# Patient Record
Sex: Female | Born: 1980 | Race: White | Hispanic: No | Marital: Married
Health system: Midwestern US, Community
[De-identification: ages and names within clinical notes are randomized; demographics above are authoritative.]

## PROBLEM LIST (undated history)

## (undated) DIAGNOSIS — Z09 Encounter for follow-up examination after completed treatment for conditions other than malignant neoplasm: Secondary | ICD-10-CM

## (undated) DIAGNOSIS — R928 Other abnormal and inconclusive findings on diagnostic imaging of breast: Secondary | ICD-10-CM

## (undated) DIAGNOSIS — Z9889 Other specified postprocedural states: Secondary | ICD-10-CM

## (undated) DIAGNOSIS — Z1231 Encounter for screening mammogram for malignant neoplasm of breast: Secondary | ICD-10-CM

## (undated) DIAGNOSIS — Z8669 Personal history of other diseases of the nervous system and sense organs: Secondary | ICD-10-CM

## (undated) HISTORY — DX: Personal history of other diseases of the nervous system and sense organs: Z86.69

---
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
## 2015-05-28 ENCOUNTER — Ambulatory Visit: Payer: Self-pay | Admitting: Family

## 2015-06-12 ENCOUNTER — Telehealth: Payer: Self-pay

## 2015-06-12 NOTE — Telephone Encounter (Signed)
Pre Visit call completed. 

## 2015-06-17 ENCOUNTER — Ambulatory Visit (INDEPENDENT_AMBULATORY_CARE_PROVIDER_SITE_OTHER): Payer: 59 | Admitting: Family

## 2015-06-17 ENCOUNTER — Encounter: Payer: Self-pay | Admitting: Family

## 2015-06-17 VITALS — BP 110/86 | HR 89 | Temp 98.2°F | Resp 15 | Ht 67.5 in | Wt 156.2 lb

## 2015-06-17 DIAGNOSIS — R202 Paresthesia of skin: Secondary | ICD-10-CM | POA: Insufficient documentation

## 2015-06-17 DIAGNOSIS — Z114 Encounter for screening for human immunodeficiency virus [HIV]: Secondary | ICD-10-CM | POA: Diagnosis not present

## 2015-06-17 LAB — BASIC METABOLIC PANEL
BUN: 10 mg/dL (ref 6–23)
CHLORIDE: 104 meq/L (ref 96–112)
CO2: 27 mEq/L (ref 19–32)
CREATININE: 0.74 mg/dL (ref 0.40–1.20)
Calcium: 9.2 mg/dL (ref 8.4–10.5)
GFR: 94.88 mL/min (ref >60.00)
GLUCOSE: 87 mg/dL (ref 70–99)
Potassium: 4.5 mEq/L (ref 3.5–5.1)
Sodium: 142 mEq/L (ref 135–145)

## 2015-06-17 LAB — CBC WITH DIFFERENTIAL/PLATELET
Basophils Absolute: 0 10*3/uL (ref 0.0–0.1)
Basophils Relative: 0.5 % (ref 0.0–3.0)
EOS PCT: 1.8 % (ref 0.0–5.0)
Eosinophils Absolute: 0.1 10*3/uL (ref 0.0–0.7)
HCT: 43.5 % (ref 36.0–46.0)
Hemoglobin: 14.6 g/dL (ref 12.0–15.0)
LYMPHS ABS: 1.8 10*3/uL (ref 0.7–4.0)
Lymphocytes Relative: 28.2 % (ref 12.0–46.0)
MCHC: 33.4 g/dL (ref 30.0–36.0)
MCV: 86.4 fl (ref 78.0–100.0)
MONO ABS: 0.5 10*3/uL (ref 0.1–1.0)
Monocytes Relative: 8.2 % (ref 3.0–12.0)
NEUTROS ABS: 4 10*3/uL (ref 1.4–7.7)
NEUTROS PCT: 61.3 % (ref 43.0–77.0)
PLATELETS: 240 10*3/uL (ref 150.0–400.0)
RBC: 5.04 Mil/uL (ref 3.87–5.11)
RDW: 12.5 % (ref 11.5–15.5)
WBC: 6.5 10*3/uL (ref 4.0–10.5)

## 2015-06-17 LAB — VITAMIN B12: VITAMIN B 12: 359 pg/mL (ref 211–911)

## 2015-06-17 LAB — FOLATE: Folate: 9.5 ng/mL (ref >5.9)

## 2015-06-17 LAB — TSH: TSH: 1.6 u[IU]/mL (ref 0.35–4.50)

## 2015-06-17 NOTE — Progress Notes (Signed)
Pre visit review using our clinic review tool, if applicable. No additional management support is needed unless otherwise documented below in the visit note. 

## 2015-06-17 NOTE — Progress Notes (Signed)
Subjective:    Patient ID: Chelsea Stafford, female    DOB: [birth year], 34 y.o.   MRN: [id number]  HPI  Chelsea Stafford is a 34 yr old female who presents today to establish care.    Reports 2 months ago she developed bilateral upper arm numbness which occurs when she rolls on her side, "almost immediately."  Hands do not tingle.  Some bilateral thigh numbness.  Denies associated neck pain, or muscle  weakness.   Arm numbness resolved when she is not laying on her side.    Review of Systems  Constitutional:       Gained 6 pounds this month  HENT: Negative for hearing loss and rhinorrhea.        Denies diplopia  Eyes: Negative for visual disturbance.       Wears glasses  Respiratory: Negative for cough and shortness of breath.   Cardiovascular: Negative for chest pain and palpitations.  Gastrointestinal: Negative for diarrhea and constipation.       Hemorrhoids since childbirth  Genitourinary: Negative for dysuria and frequency.  Musculoskeletal: Negative for myalgias and arthralgias.  Skin: Negative for rash.  Neurological: Negative for headaches.       Denies issues with coordination  Hematological: Negative for adenopathy.  Psychiatric/Behavioral:       Reports mood up and down this month- attributes to missing some of her ocps- has ob apt this month   History reviewed. No pertinent past medical history.  Social History   Social History  . Marital Status: Married    Spouse Name: N/A  . Number of Children: N/A  . Years of Education: N/A   Occupational History  . Not on file.   Social History Main Topics  . Smoking status: Never Smoker   . Smokeless tobacco: Never Used  . Alcohol Use: 0.0 oz/week    0 Standard drinks or equivalent per week     Comment: "1 beer a month or less"  . Drug Use: Not on file  . Sexual Activity: Not on file   Other Topics Concern  . Not on file   Social History Narrative   Married   3 children   2009- son, [name]   2011- [name]   2013-  [name]   Agricultural engineer   Completed bachelors at [employer]   Enjoys reading, computer, Photographer   Grew up in [place]    History reviewed. No pertinent past surgical history.  Family History  Problem Relation Age of Onset  . Hypertension Mother   . Gout Father   . Hypertension Father   . Ovarian cysts Sister     No Known Allergies  No current outpatient prescriptions on file prior to visit.   No current facility-administered medications on file prior to visit.    BP 110/86 mmHg  Pulse 89  Temp(Src) 98.2 F (36.8 C) (Oral)  Resp 15  Ht 5' 7.5" (1.715 m)  Wt 156 lb 3.2 oz (70.852 kg)  BMI 24.09 kg/m2  SpO2 100%  LMP 06/17/2015       Objective:   Physical Exam  Constitutional: She is oriented to person, place, and time. She appears well-developed and well-nourished. No distress.  HENT:  Head: Normocephalic and atraumatic.  Cardiovascular: Normal rate and regular rhythm.   No murmur heard. Pulmonary/Chest: Effort normal and breath sounds normal. No respiratory distress. She has no wheezes. She has no rales.  Musculoskeletal: She exhibits no edema.  Neurological: She is alert and  oriented to person, place, and time. She exhibits normal muscle tone. Gait normal.  Reflex Scores:      Bicep reflexes are 2+ on the right side and 2+ on the left side.      Brachioradialis reflexes are 2+ on the right side and 2+ on the left side. Bilateral UE/LE strength is 5/5  Skin:  2 raised lesions noted on left upper back  Psychiatric: She has a normal mood and affect. Her behavior is normal. Judgment and thought content normal.          Assessment & Plan:  Advised pt to schedule mole removal.

## 2015-06-17 NOTE — Patient Instructions (Signed)
Please complete lab work prior to leaving.   

## 2015-06-17 NOTE — Assessment & Plan Note (Addendum)
?   Nerve compression with position.  Will obtain TSH, B12, folate to further evaluate. If lab work is unrevealing, consider referral to neuro.

## 2015-06-18 ENCOUNTER — Encounter: Payer: Self-pay | Admitting: Family

## 2015-06-18 LAB — HIV ANTIBODY (ROUTINE TESTING W REFLEX): HIV: NONREACTIVE

## 2015-08-02 ENCOUNTER — Encounter: Payer: Self-pay | Admitting: Family

## 2015-08-02 ENCOUNTER — Ambulatory Visit (INDEPENDENT_AMBULATORY_CARE_PROVIDER_SITE_OTHER): Payer: 59 | Admitting: Family

## 2015-08-02 VITALS — BP 106/71 | HR 77 | Temp 98.3°F | Resp 16 | Ht 67.5 in | Wt 156.8 lb

## 2015-08-02 DIAGNOSIS — Z23 Encounter for immunization: Secondary | ICD-10-CM

## 2015-08-02 DIAGNOSIS — Z0001 Encounter for general adult medical examination with abnormal findings: Secondary | ICD-10-CM

## 2015-08-02 DIAGNOSIS — Z0000 Encounter for general adult medical examination without abnormal findings: Secondary | ICD-10-CM

## 2015-08-02 DIAGNOSIS — H9193 Unspecified hearing loss, bilateral: Secondary | ICD-10-CM | POA: Diagnosis not present

## 2015-08-02 LAB — URINALYSIS, ROUTINE W REFLEX MICROSCOPIC
Bilirubin Urine: NEGATIVE
KETONES UR: NEGATIVE
LEUKOCYTES UA: NEGATIVE
Nitrite: NEGATIVE
SPECIFIC GRAVITY, URINE: 1.015 (ref 1.000–1.030)
TOTAL PROTEIN, URINE-UPE24: NEGATIVE
URINE GLUCOSE: NEGATIVE
Urobilinogen, UA: 0.2 (ref 0.0–1.0)
WBC, UA: NONE SEEN (ref 0–?)
pH: 6.5 (ref 5.0–8.0)

## 2015-08-02 LAB — LIPID PANEL
CHOL/HDL RATIO: 3
Cholesterol: 144 mg/dL (ref 0–200)
HDL: 52 mg/dL (ref >39.00)
LDL CALC: 78 mg/dL (ref 0–99)
NonHDL: 92.29
TRIGLYCERIDES: 69 mg/dL (ref 0.0–149.0)
VLDL: 13.8 mg/dL (ref 0.0–40.0)

## 2015-08-02 LAB — HEPATIC FUNCTION PANEL
ALT: 8 U/L (ref 0–35)
AST: 10 U/L (ref 0–37)
Albumin: 4.4 g/dL (ref 3.5–5.2)
Alkaline Phosphatase: 43 U/L (ref 39–117)
BILIRUBIN DIRECT: 0.2 mg/dL (ref 0.0–0.3)
BILIRUBIN TOTAL: 1.3 mg/dL — AB (ref 0.2–1.2)
Total Protein: 7.2 g/dL (ref 6.0–8.3)

## 2015-08-02 NOTE — Progress Notes (Signed)
Pre visit review using our clinic review tool, if applicable. No additional management support is needed unless otherwise documented below in the visit note. 

## 2015-08-02 NOTE — Assessment & Plan Note (Signed)
Discussed healthy diet, exercise.  Obtain routine labs. Tdap today. She will check with her GYN if she got a flu shot- she can't remember. Advised pt to contact our office for a nurse visit if she still needs flu shot.

## 2015-08-02 NOTE — Patient Instructions (Signed)
Please complete lab work prior to leaving.  Try to work on healthy diet and exercise.

## 2015-08-02 NOTE — Progress Notes (Addendum)
Subjective:    Patient ID: Chelsea Stafford, female    DOB: Aug 22, 1980, 35 y.o.   MRN: [id number]  HPI  Chelsea Stafford is a 35 yr old female who presents today for cpx.  Immunizations: due for tetanus Diet: reports diet is fair.   Exercise:  Not as much as she should Pap Smear:  11/16 Dental: up to date Vision: 7/16  Wt Readings from Last 3 Encounters:  08/02/15 156 lb 12.8 oz (71.124 kg)  06/17/15 156 lb 3.2 oz (70.852 kg)        Review of Systems  Constitutional: Negative for unexpected weight change.  HENT: Positive for hearing loss. Negative for rhinorrhea.        + for subjective hearing loss  Eyes: Negative for visual disturbance.  Respiratory: Negative for cough and shortness of breath.   Cardiovascular: Negative for chest pain.  Gastrointestinal: Negative for diarrhea and constipation.  Genitourinary: Negative for dysuria, frequency and menstrual problem.  Musculoskeletal: Negative for myalgias and arthralgias.       Reports lumbar strain which is improving  Skin: Negative for rash.  Neurological: Negative for headaches.  Hematological: Negative for adenopathy.  Psychiatric/Behavioral:       Denies depression/anxiety   Past Medical History  Diagnosis Date  . History of Bell's palsy 1999    Social History   Social History  . Marital Status: Married    Spouse Name: N/A  . Number of Children: N/A  . Years of Education: N/A   Occupational History  . Not on file.   Social History Main Topics  . Smoking status: Never Smoker   . Smokeless tobacco: Never Used  . Alcohol Use: 0.0 oz/week    0 Standard drinks or equivalent per week     Comment: "1 beer a month or less"  . Drug Use: Not on file  . Sexual Activity: Not on file   Other Topics Concern  . Not on file   Social History Narrative   Married   3 children   2009- Stafford, Chelsea Stafford   2011- Chelsea Stafford   2013- Chelsea Stafford   Agricultural engineer   Completed bachelors at [employer]   Enjoys  reading, computer, Photographer   Grew up in [place]    History reviewed. No pertinent past surgical history.  Family History  Problem Relation Age of Onset  . Hypertension Mother   . Gout Father   . Hypertension Father   . Ovarian cysts Sister     No Known Allergies  No current outpatient prescriptions on file prior to visit.   No current facility-administered medications on file prior to visit.    BP 106/71 mmHg  Pulse 77  Temp(Src) 98.3 F (36.8 C) (Oral)  Resp 16  Ht 5' 7.5" (1.715 m)  Wt 156 lb 12.8 oz (71.124 kg)  BMI 24.18 kg/m2  SpO2 100%  LMP 07/08/2015        Objective:   Physical Exam Physical Exam  Constitutional: She is oriented to person, place, and time. She appears well-developed and well-nourished. No distress.  HENT:  Head: Normocephalic and atraumatic.  Right Ear: Tympanic membrane and ear canal normal.  Left Ear: Tympanic membrane and ear canal normal.  Mouth/Throat: Oropharynx is clear and moist.  Eyes: Pupils are equal, round, and reactive to light. No scleral icterus.  Neck: Normal range of motion. No thyromegaly present.  Cardiovascular: Normal rate and regular rhythm.   No murmur heard. Pulmonary/Chest: Effort normal and breath  sounds normal. No respiratory distress. He has no wheezes. She has no rales. She exhibits no tenderness.  Abdominal: Soft. Bowel sounds are normal. He exhibits no distension and no mass. There is no tenderness. There is no rebound and no guarding.  Musculoskeletal: She exhibits no edema.  Lymphadenopathy:    She has no cervical adenopathy.  Neurological: She is alert and oriented to person, place, and time. She has normal patellar reflexes. She exhibits normal muscle tone. Coordination normal.  Skin: Skin is warm and dry.  Psychiatric: She has a normal mood and affect. Her behavior is normal. Judgment and thought content normal.  Breast/pelvic: deferred       Assessment & Plan:        Assessment  & Plan:  Subjective hearing loss- refer to audiology for formal hearing testing.

## 2015-08-05 NOTE — Addendum Note (Signed)
Addended by: Debbrah Alar on: 08/05/2015 08:54 AM   Modules accepted: Miquel Dunn

## 2015-08-06 ENCOUNTER — Telehealth: Payer: Self-pay | Admitting: *Deleted

## 2015-08-06 DIAGNOSIS — R8290 Unspecified abnormal findings in urine: Secondary | ICD-10-CM

## 2015-08-06 DIAGNOSIS — R17 Unspecified jaundice: Secondary | ICD-10-CM

## 2015-08-06 NOTE — Telephone Encounter (Signed)
Orders entered

## 2015-08-06 NOTE — Telephone Encounter (Signed)
Chelsea Stafford-- is repeat LFT just for the bilirubin or do you want full hepatic function panel?  Notified pt and she voices understanding. Lab appt scheduled for 08/19/15 at 9:30am.  Future orders entered.

## 2015-08-06 NOTE — Telephone Encounter (Signed)
-----   Message from Debbrah Alar, NP sent at 08/03/2015 11:53 AM EST ----- Trace hemoglobin in urine.  Is is possible to add on urine culture or too late?  Bilirubin mildly elevated.  Please repeat UA + culture in 2 weeks (dx abnormal UA).  Also repeat lft in 2 weeks, dx hyperbilirubinemia.   Cholesterol looks good.

## 2015-08-06 NOTE — Telephone Encounter (Signed)
Full hepatic function panel please.

## 2015-08-19 ENCOUNTER — Other Ambulatory Visit (INDEPENDENT_AMBULATORY_CARE_PROVIDER_SITE_OTHER): Payer: 59

## 2015-08-19 DIAGNOSIS — R17 Unspecified jaundice: Secondary | ICD-10-CM | POA: Diagnosis not present

## 2015-08-19 DIAGNOSIS — R8290 Unspecified abnormal findings in urine: Secondary | ICD-10-CM

## 2015-08-19 LAB — HEPATIC FUNCTION PANEL
ALT: 13 U/L (ref 0–35)
AST: 13 U/L (ref 0–37)
Albumin: 4.2 g/dL (ref 3.5–5.2)
Alkaline Phosphatase: 42 U/L (ref 39–117)
BILIRUBIN TOTAL: 1.2 mg/dL (ref 0.2–1.2)
Bilirubin, Direct: 0.2 mg/dL (ref 0.0–0.3)
TOTAL PROTEIN: 7.1 g/dL (ref 6.0–8.3)

## 2015-08-19 LAB — URINALYSIS, ROUTINE W REFLEX MICROSCOPIC
BILIRUBIN URINE: NEGATIVE
Glucose, UA: NEGATIVE
HGB URINE DIPSTICK: NEGATIVE
KETONES UR: NEGATIVE
NITRITE: NEGATIVE
PH: 7 (ref 5.0–8.0)
Protein, ur: NEGATIVE
SPECIFIC GRAVITY, URINE: 1.007 (ref 1.001–1.035)

## 2015-08-19 LAB — URINALYSIS, MICROSCOPIC ONLY
Bacteria, UA: NONE SEEN [HPF]
CASTS: NONE SEEN [LPF]
CRYSTALS: NONE SEEN [HPF]
RBC / HPF: NONE SEEN RBC/HPF (ref <=2)
Yeast: NONE SEEN [HPF]

## 2015-08-20 ENCOUNTER — Encounter: Payer: Self-pay | Admitting: Family

## 2015-08-20 LAB — URINE CULTURE: Colony Count: 2000

## 2016-11-17 ENCOUNTER — Encounter: Payer: Self-pay | Admitting: Family

## 2016-11-17 ENCOUNTER — Ambulatory Visit (INDEPENDENT_AMBULATORY_CARE_PROVIDER_SITE_OTHER): Payer: 59 | Admitting: Family

## 2016-11-17 VITALS — BP 107/65 | HR 84 | Temp 98.2°F | Resp 16 | Ht 67.5 in | Wt 161.0 lb

## 2016-11-17 DIAGNOSIS — H9202 Otalgia, left ear: Secondary | ICD-10-CM

## 2016-11-17 NOTE — Progress Notes (Signed)
   Subjective:    Patient ID: Chelsea Stafford, female    DOB: [birth year], 36 y.o.   MRN: [id number]  HPI  Chelsea Stafford is a 36 yr old female who presents today with chief complaint of left ear pain. Started about 2 weeks ago. Denies fever or drainage.  Describes pain as sharp and fleeting in nature. Pain is intermittent.   Review of Systems See HPI  Past Medical History:  Diagnosis Date  . History of Bell's palsy 1999     Social History   Social History  . Marital status: Married    Spouse name: N/A  . Number of children: N/A  . Years of education: N/A   Occupational History  . Not on file.   Social History Main Topics  . Smoking status: Never Smoker  . Smokeless tobacco: Never Used  . Alcohol use 0.0 oz/week     Comment: "1 beer a month or less"  . Drug use: Unknown  . Sexual activity: Not on file   Other Topics Concern  . Not on file   Social History Narrative   Married   3 children   2009- son, [name]   2011- [name]   2013- [name]   Homemaker   Completed bachelors at [employer]   Enjoys reading, computer, Photographer   Grew up in [place]    No past surgical history on file.  Family History  Problem Relation Age of Onset  . Hypertension Mother   . Gout Father   . Hypertension Father   . Ovarian cysts Sister   . Arthritis Father     s/p THA    No Known Allergies  Current Outpatient Prescriptions on File Prior to Visit  Medication Sig Dispense Refill  . levonorgestrel-ethinyl estradiol (SRONYX) 0.1-20 MG-MCG tablet Take 1 tablet by mouth daily.     No current facility-administered medications on file prior to visit.     BP 107/65 (BP Location: Right Arm, Cuff Size: Normal)   Pulse 84   Temp 98.2 F (36.8 C) (Oral)   Resp 16   Ht 5' 7.5" (1.715 m)   Wt 161 lb (73 kg)   LMP 11/17/2016   SpO2 99%   BMI 24.84 kg/m       Objective:   Physical Exam  Constitutional: She is oriented to person, place, and  time. She appears well-developed and well-nourished.  HENT:  Left Ear: Tympanic membrane normal. No swelling or tenderness. No foreign bodies. Tympanic membrane is not injected, not scarred, not perforated, not erythematous, not retracted and not bulging.  Mouth/Throat: Oropharynx is clear and moist. No oropharyngeal exudate.  R TM occluded by cerumen L ear canal has some cerumen, but no erythema noted, no exudate  Neck: Neck supple.  Lymphadenopathy:    She has no cervical adenopathy.  Neurological: She is alert and oriented to person, place, and time.  Skin: Skin is warm and dry.  Psychiatric: She has a normal mood and affect. Her behavior is normal. Judgment and thought content normal.          Assessment & Plan:  Otalgia- No obvious sign of infection. She reports that she uses flonase daily. No obvious sign of infection I advised patient to add an antihistamine once daily such as claritin as well as tylenol or motrin as needed for pain. Call if symptoms worsen, if new symptoms or if not improved in 1 week. Pt verbalizes understanding.

## 2016-11-17 NOTE — Progress Notes (Signed)
Pre visit review using our clinic review tool, if applicable. No additional management support is needed unless otherwise documented below in the visit note. 

## 2016-11-17 NOTE — Patient Instructions (Signed)
Continue flonase, add claritin 10mg  once daily. You may use tylenol or ibuprofen as needed for ear pain. Call if new/worsening symptoms or if not improved in 1 wee.

## 2017-06-23 ENCOUNTER — Encounter: Payer: Self-pay | Admitting: Family

## 2017-06-23 ENCOUNTER — Ambulatory Visit (INDEPENDENT_AMBULATORY_CARE_PROVIDER_SITE_OTHER): Payer: 59 | Admitting: Family

## 2017-06-23 VITALS — BP 122/82 | HR 81 | Temp 98.4°F | Resp 16 | Ht 67.5 in | Wt 152.0 lb

## 2017-06-23 DIAGNOSIS — K649 Unspecified hemorrhoids: Secondary | ICD-10-CM

## 2017-06-23 DIAGNOSIS — M25511 Pain in right shoulder: Secondary | ICD-10-CM | POA: Diagnosis not present

## 2017-06-23 DIAGNOSIS — Z23 Encounter for immunization: Secondary | ICD-10-CM

## 2017-06-23 DIAGNOSIS — Z0000 Encounter for general adult medical examination without abnormal findings: Secondary | ICD-10-CM

## 2017-06-23 LAB — LIPID PANEL
CHOLESTEROL: 151 mg/dL (ref 0–200)
HDL: 53.1 mg/dL (ref >39.00)
LDL Cholesterol: 78 mg/dL (ref 0–99)
NonHDL: 97.87
TRIGLYCERIDES: 101 mg/dL (ref 0.0–149.0)
Total CHOL/HDL Ratio: 3
VLDL: 20.2 mg/dL (ref 0.0–40.0)

## 2017-06-23 LAB — CBC WITH DIFFERENTIAL/PLATELET
BASOS PCT: 0.6 % (ref 0.0–3.0)
Basophils Absolute: 0 10*3/uL (ref 0.0–0.1)
EOS PCT: 1.6 % (ref 0.0–5.0)
Eosinophils Absolute: 0.1 10*3/uL (ref 0.0–0.7)
HEMATOCRIT: 40.2 % (ref 36.0–46.0)
HEMOGLOBIN: 13.6 g/dL (ref 12.0–15.0)
Lymphocytes Relative: 26.3 % (ref 12.0–46.0)
Lymphs Abs: 1.7 10*3/uL (ref 0.7–4.0)
MCHC: 33.7 g/dL (ref 30.0–36.0)
MCV: 89.1 fl (ref 78.0–100.0)
MONO ABS: 0.5 10*3/uL (ref 0.1–1.0)
Monocytes Relative: 8.3 % (ref 3.0–12.0)
NEUTROS ABS: 4.1 10*3/uL (ref 1.4–7.7)
Neutrophils Relative %: 63.2 % (ref 43.0–77.0)
PLATELETS: 262 10*3/uL (ref 150.0–400.0)
RBC: 4.51 Mil/uL (ref 3.87–5.11)
RDW: 12.5 % (ref 11.5–15.5)
WBC: 6.6 10*3/uL (ref 4.0–10.5)

## 2017-06-23 LAB — BASIC METABOLIC PANEL
BUN: 12 mg/dL (ref 6–23)
CALCIUM: 9 mg/dL (ref 8.4–10.5)
CO2: 28 meq/L (ref 19–32)
Chloride: 105 mEq/L (ref 96–112)
Creatinine, Ser: 0.73 mg/dL (ref 0.40–1.20)
GFR: 95.29 mL/min (ref >60.00)
Glucose, Bld: 83 mg/dL (ref 70–99)
Potassium: 4.5 mEq/L (ref 3.5–5.1)
SODIUM: 140 meq/L (ref 135–145)

## 2017-06-23 LAB — URINALYSIS, ROUTINE W REFLEX MICROSCOPIC
BILIRUBIN URINE: NEGATIVE
HGB URINE DIPSTICK: NEGATIVE
KETONES UR: NEGATIVE
LEUKOCYTES UA: NEGATIVE
NITRITE: NEGATIVE
RBC / HPF: NONE SEEN (ref 0–?)
Specific Gravity, Urine: 1.015 (ref 1.000–1.030)
Total Protein, Urine: NEGATIVE
UROBILINOGEN UA: 1 (ref 0.0–1.0)
Urine Glucose: NEGATIVE
WBC UA: NONE SEEN (ref 0–?)
pH: 7 (ref 5.0–8.0)

## 2017-06-23 LAB — HEPATIC FUNCTION PANEL
ALT: 14 U/L (ref 0–35)
AST: 13 U/L (ref 0–37)
Albumin: 4.5 g/dL (ref 3.5–5.2)
Alkaline Phosphatase: 52 U/L (ref 39–117)
Bilirubin, Direct: 0.2 mg/dL (ref 0.0–0.3)
TOTAL PROTEIN: 7 g/dL (ref 6.0–8.3)
Total Bilirubin: 1.1 mg/dL (ref 0.2–1.2)

## 2017-06-23 LAB — TSH: TSH: 1.29 u[IU]/mL (ref 0.35–4.50)

## 2017-06-23 NOTE — Progress Notes (Signed)
Subjective:    Patient ID: Chelsea Stafford, female    DOB: Jun 27, 1981, 36 y.o.   MRN: [id number]  HPI  [name] is a 36 yr old female who presents today for cpx.  Patient presents today for complete physical.  Immunizations: flu shot today, tetanus Diet: healthy Exercise: regular exercise Pap Smear: will complete next week ([facility]) Dental: up to date Vision:  2 years ago   Wt Readings from Last 3 Encounters:  06/23/17 152 lb (68.9 kg)  11/17/16 161 lb (73 kg)  08/02/15 156 lb 12.8 oz (71.1 kg)    Reports right sided shoulder pain- started in October.  She does karate, has been doing a lot of shoulder exercises.  She uses aleve prn.    Hemorrhoids- reports that they got "very painful in October" Used preparation H with improvement in pain.     Review of Systems  Constitutional: Negative for unexpected weight change.  HENT: Negative for rhinorrhea.   Respiratory: Negative for cough.   Cardiovascular: Negative for leg swelling.  Gastrointestinal: Negative for constipation and diarrhea.  Genitourinary: Negative for dysuria and frequency.       Periods can be a little heavy  Musculoskeletal:       See HPI  Neurological:       Denies frequent headaches  Hematological: Negative for adenopathy.  Psychiatric/Behavioral:       Denies depression/anxiety   Past Medical History:  Diagnosis Date  . History of Bell's palsy 1999     Social History   Socioeconomic History  . Marital status: Married    Spouse name: Not on file  . Number of children: Not on file  . Years of education: Not on file  . Highest education level: Not on file  Social Needs  . Financial resource strain: Not on file  . Food insecurity - worry: Not on file  . Food insecurity - inability: Not on file  . Transportation needs - medical: Not on file  . Transportation needs - non-medical: Not on file  Occupational History  . Not on file  Tobacco Use  . Smoking status: Never Smoker  . Smokeless  tobacco: Never Used  Substance and Sexual Activity  . Alcohol use: Yes    Alcohol/week: 0.0 oz    Comment: "1 beer a month or less"  . Drug use: No  . Sexual activity: Yes    Birth control/protection: Pill  Other Topics Concern  . Not on file  Social History Narrative   Married   3 children   2009- son, Chelsea Stafford   2011- Chelsea Stafford Son   2013- Chelsea Stafford daughter   Homemaker   Completed bachelors at [employer]   Enjoys reading, computer, Photographer   Grew up in [place]    No past surgical history on file.  Family History  Problem Relation Age of Onset  . Hypertension Mother   . Gout Father   . Hypertension Father   . Arthritis Father        s/p THA  . Prostate cancer Father   . Ovarian cysts Sister     No Known Allergies  Current Outpatient Medications on File Prior to Visit  Medication Sig Dispense Refill  . levonorgestrel-ethinyl estradiol (SRONYX) 0.1-20 MG-MCG tablet Take 1 tablet by mouth daily.     No current facility-administered medications on file prior to visit.     BP 122/82 (BP Location: Left Arm, Patient Position: Sitting, Cuff Size: Small)   Pulse 81  Temp 98.4 F (36.9 C) (Oral)   Resp 16   Ht 5' 7.5" (1.715 m)   Wt 152 lb (68.9 kg)   LMP 06/21/2017   SpO2 100%   BMI 23.46 kg/m       Objective:   Physical Exam  Physical Exam  Constitutional: She is oriented to person, place, and time. She appears well-developed and well-nourished. No distress.  HENT:  Head: Normocephalic and atraumatic.  Right Ear: Tympanic membrane and ear canal normal.  Left Ear: Tympanic membrane and ear canal normal.  Mouth/Throat: Oropharynx is clear and moist.  Eyes: Pupils are equal, round, and reactive to light. No scleral icterus.  Neck: Normal range of motion. No thyromegaly present.  Cardiovascular: Normal rate and regular rhythm.   No murmur heard. Pulmonary/Chest: Effort normal and breath sounds normal. No respiratory distress. He  has no wheezes. She has no rales. She exhibits no tenderness.  Abdominal: Soft. Bowel sounds are normal. She exhibits no distension and no mass. There is no tenderness. There is no rebound and no guarding.  Musculoskeletal: She exhibits no edema. some mild pain with passive ROM of the right shoulder- + crepitus Lymphadenopathy:    She has no cervical adenopathy.  Neurological: She is alert and oriented to person, place, and time. She has normal patellar reflexes. She exhibits normal muscle tone. Coordination normal.  Skin: Skin is warm and dry.  Psychiatric: She has a normal mood and affect. Her behavior is normal. Judgment and thought content normal.  Breast/pelvic: deferred breast, pt declined rectal exam          Assessment & Plan:         Assessment & Plan:  Preventative care- encouraged pt to continue healthy diet, regular exercise. Immunizations reviewed, flu shot given today.  Obtain routine lab work. Pap will be completed with GYN.   Right shoulder pain- will refer to sports medicine.   Hemorrhoids- reports improved symptoms today, declines rectal exam. Discussed importance of high fiber diet. Follow up as needed.

## 2017-06-28 ENCOUNTER — Ambulatory Visit: Payer: 59 | Admitting: Family Medicine

## 2017-06-29 ENCOUNTER — Ambulatory Visit: Payer: 59 | Admitting: Family Medicine

## 2017-07-01 ENCOUNTER — Encounter: Payer: Self-pay | Admitting: Family Medicine

## 2017-07-01 ENCOUNTER — Ambulatory Visit: Payer: 59 | Admitting: Family Medicine

## 2017-07-01 DIAGNOSIS — M25511 Pain in right shoulder: Secondary | ICD-10-CM | POA: Diagnosis not present

## 2017-07-01 NOTE — Patient Instructions (Signed)
You have rotator cuff impingement with trapezius spasms. Try to avoid painful activities (overhead activities, lifting with extended arm) as much as possible. Aleve 2 tabs twice a day with food OR ibuprofen 3 tabs three times a day with food for pain and inflammation - typically take for 7-10 days then as needed. Can take tylenol in addition to this. Subacromial injection may be beneficial to help with pain and to decrease inflammation. Consider physical therapy with transition to home exercise program. Do home exercise program with theraband and scapular stabilization exercises daily 3 sets of 10 once a day. If not improving at follow-up we will consider imaging, injection, physical therapy, and/or nitro patches. Follow up with me in 6 weeks.

## 2017-07-02 ENCOUNTER — Encounter: Payer: Self-pay | Admitting: Family Medicine

## 2017-07-02 DIAGNOSIS — M25511 Pain in right shoulder: Secondary | ICD-10-CM | POA: Insufficient documentation

## 2017-07-02 NOTE — Assessment & Plan Note (Signed)
2/2 rotator cuff impingement with trapezius spasms.  Shown home exercises to do daily.  Aleve or ibuprofen.  Consider injections, physical therapy, nitro patches, imaging if not improving.  F/u in 6 weeks.

## 2017-07-02 NOTE — Progress Notes (Signed)
PCP and consultation requested by: Debbrah Alar, NP  Subjective:   HPI: Patient is a 36 y.o. female here for right shoulder pain.  Patient reports she's had about 1 month of lateral right shoulder pain. Bothers worse with reaching across body and holding steering wheel, shoveling. No night pain. Has not been using anything for this though has a heated mattress pad which helps. No acute trauma or injury. Pain currently 1/10 but up to 4/10 and sharp at worst. No skin changes, numbness. Right handed.  Past Medical History:  Diagnosis Date  . History of Bell's palsy 1999    Current Outpatient Medications on File Prior to Visit  Medication Sig Dispense Refill  . levonorgestrel-ethinyl estradiol (SRONYX) 0.1-20 MG-MCG tablet Take 1 tablet by mouth daily.     No current facility-administered medications on file prior to visit.     History reviewed. No pertinent surgical history.  No Known Allergies  Social History   Socioeconomic History  . Marital status: Married    Spouse name: Not on file  . Number of children: Not on file  . Years of education: Not on file  . Highest education level: Not on file  Social Needs  . Financial resource strain: Not on file  . Food insecurity - worry: Not on file  . Food insecurity - inability: Not on file  . Transportation needs - medical: Not on file  . Transportation needs - non-medical: Not on file  Occupational History  . Not on file  Tobacco Use  . Smoking status: Never Smoker  . Smokeless tobacco: Never Used  Substance and Sexual Activity  . Alcohol use: Yes    Alcohol/week: 0.0 oz    Comment: "1 beer a month or less"  . Drug use: No  . Sexual activity: Yes    Birth control/protection: Pill  Other Topics Concern  . 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 Hypertension Mother   . Gout Father   . Hypertension Father   . Arthritis Father        s/p THA  . Prostate cancer Father   . Ovarian cysts Sister     BP 116/72   Ht 5\' 8"  (1.727 m)   Wt 153 lb (69.4 kg)   LMP 06/21/2017   BMI 23.26 kg/m   Review of Systems: See HPI above.     Objective:  Physical Exam:  Gen: NAD, comfortable in exam room  Right shoulder: No swelling, ecchymoses.  No gross deformity. TTP right trapezius.  No other tenderness. FROM with painful arc. Positive Hawkins, Neers. Negative Yergasons. Strength 5/5 with empty can and resisted internal/external rotation. Negative apprehension. NV intact distally.  Left shoulder: No swelling, ecchymoses.  No gross deformity. No TTP. FROM. Strength 5/5 with empty can and resisted internal/external rotation. NV intact distally.   Assessment & Plan:  1. Right shoulder pain - 2/2 rotator cuff impingement with trapezius spasms.  Shown home exercises to do daily.  Aleve or ibuprofen.  Consider injections, physical therapy, nitro patches, imaging if not improving.  F/u in 6 weeks.

## 2017-07-16 DIAGNOSIS — Z01419 Encounter for gynecological examination (general) (routine) without abnormal findings: Secondary | ICD-10-CM | POA: Diagnosis not present

## 2017-08-09 ENCOUNTER — Emergency Department (HOSPITAL_BASED_OUTPATIENT_CLINIC_OR_DEPARTMENT_OTHER): Payer: 59

## 2017-08-09 ENCOUNTER — Other Ambulatory Visit: Payer: Self-pay

## 2017-08-09 ENCOUNTER — Ambulatory Visit (INDEPENDENT_AMBULATORY_CARE_PROVIDER_SITE_OTHER): Payer: 59 | Admitting: Medical

## 2017-08-09 ENCOUNTER — Encounter: Payer: Self-pay | Admitting: Medical

## 2017-08-09 ENCOUNTER — Emergency Department (HOSPITAL_BASED_OUTPATIENT_CLINIC_OR_DEPARTMENT_OTHER)
Admission: EM | Admit: 2017-08-09 | Discharge: 2017-08-09 | Disposition: A | Discharge status: Home / Self Care | Payer: 59 | Attending: Emergency Medicine | Admitting: Emergency Medicine

## 2017-08-09 ENCOUNTER — Encounter (HOSPITAL_BASED_OUTPATIENT_CLINIC_OR_DEPARTMENT_OTHER): Payer: Self-pay

## 2017-08-09 VITALS — BP 115/82 | HR 121 | Temp 99.0°F | Resp 16 | Ht 68.0 in | Wt 155.2 lb

## 2017-08-09 DIAGNOSIS — J0100 Acute maxillary sinusitis, unspecified: Secondary | ICD-10-CM | POA: Diagnosis not present

## 2017-08-09 DIAGNOSIS — R52 Pain, unspecified: Secondary | ICD-10-CM | POA: Diagnosis not present

## 2017-08-09 DIAGNOSIS — R05 Cough: Secondary | ICD-10-CM

## 2017-08-09 DIAGNOSIS — R079 Chest pain, unspecified: Secondary | ICD-10-CM | POA: Diagnosis not present

## 2017-08-09 DIAGNOSIS — J029 Acute pharyngitis, unspecified: Secondary | ICD-10-CM | POA: Diagnosis not present

## 2017-08-09 DIAGNOSIS — J111 Influenza due to unidentified influenza virus with other respiratory manifestations: Secondary | ICD-10-CM | POA: Insufficient documentation

## 2017-08-09 DIAGNOSIS — R1110 Vomiting, unspecified: Secondary | ICD-10-CM | POA: Diagnosis not present

## 2017-08-09 DIAGNOSIS — R0602 Shortness of breath: Secondary | ICD-10-CM | POA: Diagnosis not present

## 2017-08-09 DIAGNOSIS — R059 Cough, unspecified: Secondary | ICD-10-CM

## 2017-08-09 LAB — COMPREHENSIVE METABOLIC PANEL WITH GFR
ALT: 22 U/L (ref 14–54)
AST: 22 U/L (ref 15–41)
Albumin: 4.4 g/dL (ref 3.5–5.0)
Alkaline Phosphatase: 62 U/L (ref 38–126)
Anion gap: 11 (ref 5–15)
BUN: 14 mg/dL (ref 6–20)
CO2: 20 mmol/L — ABNORMAL LOW (ref 22–32)
Calcium: 8.6 mg/dL — ABNORMAL LOW (ref 8.9–10.3)
Chloride: 102 mmol/L (ref 101–111)
Creatinine, Ser: 0.85 mg/dL (ref 0.44–1.00)
GFR calc Af Amer: >60 mL/min
GFR calc non Af Amer: >60 mL/min
Glucose, Bld: 100 mg/dL — ABNORMAL HIGH (ref 65–99)
Potassium: 3.4 mmol/L — ABNORMAL LOW (ref 3.5–5.1)
Sodium: 133 mmol/L — ABNORMAL LOW (ref 135–145)
Total Bilirubin: 2.2 mg/dL — ABNORMAL HIGH (ref 0.3–1.2)
Total Protein: 7.7 g/dL (ref 6.5–8.1)

## 2017-08-09 LAB — CBC WITH DIFFERENTIAL/PLATELET
Basophils Absolute: 0 10*3/uL (ref 0.0–0.1)
Basophils Relative: 0 %
EOS PCT: 0 %
Eosinophils Absolute: 0 10*3/uL (ref 0.0–0.7)
HCT: 38.1 % (ref 36.0–46.0)
HEMOGLOBIN: 13.4 g/dL (ref 12.0–15.0)
LYMPHS ABS: 0.4 10*3/uL — AB (ref 0.7–4.0)
LYMPHS PCT: 10 %
MCH: 30 pg (ref 26.0–34.0)
MCHC: 35.2 g/dL (ref 30.0–36.0)
MCV: 85.2 fL (ref 78.0–100.0)
Monocytes Absolute: 0.6 10*3/uL (ref 0.1–1.0)
Monocytes Relative: 12 %
NEUTROS PCT: 78 %
Neutro Abs: 3.5 10*3/uL (ref 1.7–7.7)
Platelets: 167 10*3/uL (ref 150–400)
RBC: 4.47 MIL/uL (ref 3.87–5.11)
RDW: 12.1 % (ref 11.5–15.5)
WBC: 4.5 10*3/uL (ref 4.0–10.5)

## 2017-08-09 LAB — URINALYSIS, MICROSCOPIC (REFLEX)

## 2017-08-09 LAB — I-STAT CG4 LACTIC ACID, ED: Lactic Acid, Venous: 1.08 mmol/L (ref 0.5–1.9)

## 2017-08-09 LAB — URINALYSIS, ROUTINE W REFLEX MICROSCOPIC
Glucose, UA: NEGATIVE mg/dL
Ketones, ur: >80 mg/dL — AB
Nitrite: NEGATIVE
Protein, ur: 30 mg/dL — AB
Specific Gravity, Urine: >1.03 — ABNORMAL HIGH (ref 1.005–1.030)
pH: 6 (ref 5.0–8.0)

## 2017-08-09 LAB — POCT INFLUENZA A/B
INFLUENZA A, POC: POSITIVE — AB
Influenza B, POC: NEGATIVE

## 2017-08-09 LAB — PREGNANCY, URINE: Preg Test, Ur: NEGATIVE

## 2017-08-09 LAB — POCT RAPID STREP A (OFFICE): Rapid Strep A Screen: NEGATIVE

## 2017-08-09 MED ORDER — BENZONATATE 100 MG PO CAPS: 100.0000 mg | ORAL_CAPSULE | Freq: Three times a day (TID) | ORAL | 0 refills | Status: DC | PRN

## 2017-08-09 MED ORDER — SODIUM CHLORIDE 0.9 % IV BOLUS (SEPSIS)
1000.0000 mL | Freq: Once | INTRAVENOUS | Status: AC
  Administered 2017-08-09: 1000 mL via INTRAVENOUS

## 2017-08-09 MED ORDER — OSELTAMIVIR PHOSPHATE 75 MG PO CAPS: 75.0000 mg | ORAL_CAPSULE | Freq: Two times a day (BID) | ORAL | 0 refills | Status: DC

## 2017-08-09 MED ORDER — POTASSIUM CHLORIDE CRYS ER 20 MEQ PO TBCR
40.0000 meq | EXTENDED_RELEASE_TABLET | Freq: Once | ORAL | Status: AC
Start: 2017-08-09 — End: 2017-08-09
  Administered 2017-08-09: 40 meq via ORAL
  Filled 2017-08-09: qty 2

## 2017-08-09 MED ORDER — IBUPROFEN 800 MG PO TABS
800.0000 mg | ORAL_TABLET | Freq: Once | ORAL | Status: AC
  Administered 2017-08-09: 800 mg via ORAL
  Filled 2017-08-09: qty 1

## 2017-08-09 MED ORDER — AZITHROMYCIN 250 MG PO TABS: ORAL_TABLET | ORAL | 0 refills | Status: DC

## 2017-08-09 NOTE — Patient Instructions (Addendum)
You do have positive flu test presently.  In addition it appears to have sinus infection.  Your lungs sound clear but you may benefit from a chest x-ray based on your overall presentation.  With your history of not eating hardly at all over the weekend and drinking very little, I am concerned that you were dehydrated.  Your pulse initially stayed close to 130 for most of the exam.  In light of your probable moderate dehydration and your overall clinical presentation, I do think it would benefit from ED evaluation and IV hydration.  They might decide to get chest x-ray and CBC.  I went ahead and prescribe and sent Tamiflu to your pharmacy.  For your cough prescribed benzonatate.   I am giving you a printed prescription of a azithromycin for a sinus infection.  Please show the emergency department this prescription.  They might decide after workup to give you different antibiotic.  Also go ahead and send in Zofran prescription for nausea and vomiting.  Follow-up with [place] as recommended by the ED.  Regarding your positive flu and your 3 children at home, I recommend you contact their pediatrician and notify them of your positive flu test results  Note flu test was positive for type a.  Medical assistant verbally told me this twice then later saw epic she accidentally put it in as negative.  Called her and confirmed the positive result and medical assistant will change it in epic tomorrow.

## 2017-08-09 NOTE — ED Notes (Signed)
Pt given cup to attempt urine sample. 

## 2017-08-09 NOTE — Progress Notes (Signed)
Subjective:    Patient ID: [facility], female    DOB: February 14, 1981, 37 y.o.   MRN: [id number]  HPI  Pt states Friday night she got sinus pain, ear pain, fatigue, body aches, and ha.  Describes severe fatigue and states some lightheadedness.  Pt has taken dayquil and ibuprofen. No ibuprofen today. Did take dayquil today.  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 64 yo, 18 yo and 60 yr old.  She admits not drinking or eating much.  LMP- 3 weeks. Pt is on ocp and husband uses condoms in addition to.    Review of Systems  Cardiovascular: Negative for chest pain and palpitations.  Gastrointestinal: Positive for nausea and vomiting. Negative for abdominal pain, constipation, diarrhea and rectal pain.       Pt started throwing up since Saturday.  Musculoskeletal: Positive for myalgias. Negative for arthralgias, back pain and neck stiffness.  Skin: Negative for rash.  Neurological: Negative for dizziness and headaches.  Hematological: Negative for adenopathy. Does not bruise/bleed easily.  Psychiatric/Behavioral: Negative for behavioral problems, decreased concentration, dysphoric mood and hallucinations.   Past Medical History:  Diagnosis Date  . History of Bell's palsy 1999     Social History   Socioeconomic History  . Marital status: Married    Spouse name: Not on file  . Number of children: Not on file  . Years of education: Not on file  . Highest education level: Not on file  Social Needs  . Financial resource strain: Not on file  . Food insecurity - worry: Not on file  . Food insecurity - inability: Not on file  . Transportation needs - medical: Not on file  . Transportation needs - non-medical: Not on file  Occupational History  . Not on file  Tobacco Use  . Smoking status: Never Smoker  . Smokeless tobacco: Never Used  Substance and Sexual Activity  . Alcohol use: Yes    Alcohol/week: 0.0 oz    Comment: "1 beer a month or less"  . Drug use: No  . Sexual activity:  Yes    Birth control/protection: Pill  Other Topics Concern  . Not on file  Social History Narrative   Married   3 children   2009- son, [name]   2011- [name]   2013- [name]   Homemaker   Completed bachelors at [employer])   Enjoys reading, computer, Photographer   Grew up in [place]    No past surgical history on file.  Family History  Problem Relation Age of Onset  . Hypertension Mother   . Gout Father   . Hypertension Father   . Arthritis Father        s/p THA  . Prostate cancer Father   . Ovarian cysts Sister     No Known Allergies  Current Outpatient Medications on File Prior to Visit  Medication Sig Dispense Refill  . levonorgestrel-ethinyl estradiol (SRONYX) 0.1-20 MG-MCG tablet Take 1 tablet by mouth daily.     No current facility-administered medications on file prior to visit.     BP 115/82   Pulse (!) 121   Temp 99 F (37.2 C) (Oral)   Resp 16   Ht 5\' 8"  (1.727 m)   Wt 155 lb 3.2 oz (70.4 kg)   SpO2 100%   BMI 23.60 kg/m        Objective:   Physical Exam  General  Mental Status - Alert. General Appearance - Well groomed.  Appears moderately ill.  Lays supine on initial exam moaning describing how bad she feels..  Severe fatigue.  Skin Rashes- No Rashes.  HEENT Head- Normal. Ear Auditory Canal - Left- Normal. Right - Normal.Tympanic Membrane- Left- Normal. Right- Normal. Eye Sclera/Conjunctiva- Left- Normal. Right- Normal. Nose & Sinuses Nasal Mucosa- Left-  Boggy and Congested. Right-  Boggy and  Congested.Bilateral maxillary and frontal sinus pressure to light touch. Mouth & Throat Lips: Upper Lip- Normal: no dryness, cracking, pallor, cyanosis, or vesicular eruption. Lower Lip-Normal: no dryness, cracking, pallor, cyanosis or vesicular eruption. Buccal Mucosa- Bilateral- No Aphthous ulcers. Oropharynx- No Discharge or Erythema. Tonsils: Characteristics- Bilateral-mild erythema.  Size/Enlargement- Bilateral- No enlargement. Discharge- bilateral-None.  Neck Neck- Supple. No Masses.   Chest and Lung Exam Auscultation: Breath Sounds:-Clear even and unlabored.  Cardiovascular Auscultation:Rythm- Regular, rate and rhythm. Murmurs & Other Heart Sounds:Ausculatation of the heart reveal- No Murmurs.  Lymphatic Head & Neck General Head & Neck Lymphatics: Bilateral: Description- No Localized lymphadenopathy.       Assessment & Plan:  You do have positive flu test presently.  In addition it appears to have sinus infection.  Your lungs sound clear but you may benefit from a chest x-ray based on your overall presentation.  With your history of not eating hardly at all over the weekend and drinking very little, I am concerned that you were dehydrated.  Your pulse initially stayed close to 130 for most of the exam.  In light of your probable moderate dehydration and your overall clinical presentation, I do think it would benefit from ED evaluation and IV hydration.  They might decide to get chest x-ray and CBC.  I went ahead and prescribe and sent Tamiflu to your pharmacy.  For your cough prescribed benzonatate.   I am giving you a printed prescription of a azithromycin for a sinus infection.  Please show the emergency department this prescription.  They might decide after workup to give you different antibiotic.  Also go ahead and send in Zofran prescription for nausea and vomiting.  Follow-up with [place] as recommended by the ED.  Regarding your positive flu and your 3 children at home, I recommend you contact their pediatrician and notify them of your positive flu test results.  Discussed with patient option of aggressive hydration with propel and Zofran for nausea.  I explained thought she would feel better quicker got IV hydration.  She expressed would go to ED.  Note flu test was positive for type a.  Medical assistant verbally told me this twice then later saw epic  she accidentally put it in as negative.  Called her and confirmed the positive result and medical assistant will change it in epic tomorrow.  Waleed Dettman, Percell Miller, PA-C

## 2017-08-09 NOTE — ED Triage Notes (Addendum)
Pt sent from PCP for tachycardia and + flu in office today-requests CXR and IVF-pt presents to triage in w/-NAD

## 2017-08-09 NOTE — ED Provider Notes (Signed)
[facility] Provider Note   CSN: [id number] Arrival date & time: 08/09/17  1704     History   Chief Complaint Chief Complaint  Patient presents with  . Influenza    HPI Chelsea [facility] is a 37 y.o. female.  37yo F who p/w influenza. 3 days ago she began feeling sick with body aches, cough, runny nose, vomiting, and diarrhea beginning today. 2 of her children are ill with similar symptoms. She reports mild dysuria just now but not previously. She has been taking nyquil and dayquil; Last dose of medication was dayquil ~9:30am. She saw PCP today, tested positive for influenza, and was sent here due to concerns for dehydration.    The history is provided by the patient.  Influenza    Past Medical History:  Diagnosis Date  . History of Bell's palsy 1999    Patient Active Problem List   Diagnosis Date Noted  . Right shoulder pain 07/02/2017  . Preventative health care 08/02/2015  . Paresthesia 06/17/2015    History reviewed. No pertinent surgical history.  OB History    No data available       Home Medications    Prior to Admission medications   Medication Sig Start Date End Date Taking? Authorizing Provider  Ondansetron HCl (ZOFRAN PO) Take by mouth.   Yes [provider]  azithromycin (ZITHROMAX) 250 MG tablet Take 2 tablets by mouth on day 1, followed by 1 tablet by mouth daily for 4 days. 08/09/17   Saguier, Percell Miller, PA-C  benzonatate (TESSALON) 100 MG capsule Take 1 capsule (100 mg total) by mouth 3 (three) times daily as needed for cough. 08/09/17   Saguier, Percell Miller, PA-C  levonorgestrel-ethinyl estradiol (SRONYX) 0.1-20 MG-MCG tablet Take 1 tablet by mouth daily.    [provider]  oseltamivir (TAMIFLU) 75 MG capsule Take 1 capsule (75 mg total) by mouth 2 (two) times daily. 08/09/17   Saguier, Percell Miller, PA-C    Family History Family History  Problem Relation Age of Onset  . Hypertension Mother   . Gout Father   .  Hypertension Father   . Arthritis Father        s/p THA  . Prostate cancer Father   . Ovarian cysts Sister     Social History Social History   Tobacco Use  . Smoking status: Never Smoker  . Smokeless tobacco: Never Used  Substance Use Topics  . Alcohol use: Yes    Alcohol/week: 0.0 oz    Comment: rare  . Drug use: No     Allergies   Patient has no known allergies.   Review of Systems Review of Systems All other systems reviewed and are negative except that which was mentioned in HPI   Physical Exam Updated Vital Signs BP 119/78 (BP Location: Right Arm)   Pulse (!) 114   Temp 100.1 F (37.8 C) (Oral)   Resp 18   LMP 07/12/2017   SpO2 95%   Physical Exam  Constitutional: She is oriented to person, place, and time. She appears well-developed and well-nourished. No distress.  uncomfortable  HENT:  Head: Normocephalic and atraumatic.  Moist mucous membranes  Eyes: Conjunctivae are normal.  Neck: Neck supple.  Cardiovascular: Regular rhythm and normal heart sounds. Tachycardia present.  No murmur heard. Pulmonary/Chest: Effort normal and breath sounds normal.  Abdominal: Soft. Bowel sounds are normal. She exhibits no distension. There is no tenderness.  Musculoskeletal: She exhibits no edema.  Neurological: She is alert and  oriented to person, place, and time.  Fluent speech  Skin: Skin is warm and dry.  Psychiatric: She has a normal mood and affect. Judgment normal.  Nursing note and vitals reviewed.    ED Treatments / Results  Labs (all labs ordered are listed, but only abnormal results are displayed) Labs Reviewed  CBC WITH DIFFERENTIAL/PLATELET - Abnormal; Notable for the following components:      Result Value   Lymphs Abs 0.4 (*)    All other components within normal limits  URINALYSIS, ROUTINE W REFLEX MICROSCOPIC - Abnormal; Notable for the following components:   Color, Urine ORANGE (*)    APPearance HAZY (*)    Specific Gravity, Urine >1.030  (*)    Hgb urine dipstick SMALL (*)    Bilirubin Urine MODERATE (*)    Ketones, ur >80 (*)    Protein, ur 30 (*)    Leukocytes, UA TRACE (*)    All other components within normal limits  URINALYSIS, MICROSCOPIC (REFLEX) - Abnormal; Notable for the following components:   Bacteria, UA RARE (*)    Squamous Epithelial / LPF 0-5 (*)    All other components within normal limits  PREGNANCY, URINE  COMPREHENSIVE METABOLIC PANEL  I-STAT CG4 LACTIC ACID, ED    EKG  EKG Interpretation None       Radiology Dg Chest 2 View  Result Date: 08/09/2017 CLINICAL DATA:  Cough, nausea, vomiting, shortness of breath, weakness and right chest pain since 08/06/2017. EXAM: CHEST  2 VIEW COMPARISON:  None. FINDINGS: Lungs are clear. Heart size is normal. No pneumothorax or pleural fluid. No bony abnormality. IMPRESSION: Negative chest. Electronically Signed   By: Inge Rise M.D.   On: 08/09/2017 17:39    Procedures Procedures (including critical care time)  Medications Ordered in ED Medications  ibuprofen (ADVIL,MOTRIN) tablet 800 mg (800 mg Oral Given 08/09/17 1715)  sodium chloride 0.9 % bolus 1,000 mL (1,000 mLs Intravenous New Bag/Given 08/09/17 1805)     Initial Impression / Assessment and Plan / ED Course  I have reviewed the triage vital signs and the nursing notes.  Pertinent labs & imaging results that were available during my care of the patient were reviewed by me and considered in my medical decision making (see chart for details).     Pt flu positive, uncomfortable but nontoxic on exam, with tachycardia but clear breath sounds.  Abdomen soft.  Labs show normal lactate, normal creatinine, urine is concentrated suggestive of mild dehydration but no evidence of infection.  Normal CBC and clear chest x-ray.  After 2 L of IV fluids as well as ibuprofen, she was tolerating liquids and stated that she felt better.  She is 3 days into illness and has no underlying comorbidities to place  her in high risk category with influenza, therefore I feel the risks of side effects from tamiflu outweigh benefits.  Pt in agreement. Discussed supportive measures and extensively reviewed return precautions.   Final Clinical Impressions(s) / ED Diagnoses   Final diagnoses:  Influenza    ED Discharge Orders    None       Little, Wenda Overland, MD 08/10/17 [phone]

## 2017-08-10 ENCOUNTER — Telehealth: Payer: Self-pay | Admitting: Family

## 2017-08-10 NOTE — Telephone Encounter (Signed)
Melissa-- Rx is on current med list as historical with no strength or directions. Please advise?

## 2017-08-10 NOTE — Telephone Encounter (Signed)
Copied from [place]. Topic: Quick Communication - Rx Refill/Question >> Aug 10, 2017  4:54 PM Oliver Pila B wrote: Medication: Ondansetron HCl (ZOFRAN PO) [[id number]]  Has the patient contacted their pharmacy? Yes.   (Agent: If no, request that the patient contact the pharmacy for the refill.) Preferred Pharmacy (with phone number or street name): [facility] Agent: Please be advised that RX refills may take up to 3 business days. We ask that you follow-up with your pharmacy.

## 2017-08-11 MED ORDER — ONDANSETRON HCL 4 MG PO TABS: 4.0000 mg | ORAL_TABLET | Freq: Three times a day (TID) | ORAL | 0 refills | Status: DC | PRN

## 2017-08-12 ENCOUNTER — Ambulatory Visit: Payer: 59 | Admitting: Family Medicine

## 2017-08-17 ENCOUNTER — Ambulatory Visit: Payer: 59 | Admitting: Family Medicine

## 2017-08-17 ENCOUNTER — Encounter: Payer: Self-pay | Admitting: Family Medicine

## 2017-08-17 DIAGNOSIS — M25511 Pain in right shoulder: Secondary | ICD-10-CM | POA: Diagnosis not present

## 2017-08-17 MED ORDER — NITROGLYCERIN 0.2 MG/HR TD PT24: MEDICATED_PATCH | TRANSDERMAL | 1 refills | Status: DC

## 2017-08-17 NOTE — Progress Notes (Signed)
PCP and consultation requested by: Debbrah Alar, NP  Subjective:   HPI: Patient is a 37 y.o. female here for right shoulder pain.  07/01/17: Patient reports she's had about 1 month of lateral right shoulder pain. Bothers worse with reaching across body and holding steering wheel, shoveling. No night pain. Has not been using anything for this though has a heated mattress pad which helps. No acute trauma or injury. Pain currently 1/10 but up to 4/10 and sharp at worst. No skin changes, numbness. Right handed.  08/17/17: Patient reports she's not noticed improvement since last visit. Seemed worse when trying to do home exercises so stopped these. She feels resting has improved her back to where she was pain-wise at last visit. Not taking medications - was taking aleve after visit. Pain 0/10 at rest but up to 5-6/10 and sharp at worst lateral right shoulder. No skin changes, numbness.  Past Medical History:  Diagnosis Date  . History of Bell's palsy 1999    Current Outpatient Medications on File Prior to Visit  Medication Sig Dispense Refill  . azithromycin (ZITHROMAX) 250 MG tablet Take 2 tablets by mouth on day 1, followed by 1 tablet by mouth daily for 4 days. 6 tablet 0  . benzonatate (TESSALON) 100 MG capsule Take 1 capsule (100 mg total) by mouth 3 (three) times daily as needed for cough. 21 capsule 0  . levonorgestrel-ethinyl estradiol (SRONYX) 0.1-20 MG-MCG tablet Take 1 tablet by mouth daily.    . ondansetron (ZOFRAN) 4 MG tablet Take 1 tablet (4 mg total) by mouth every 8 (eight) hours as needed for nausea or vomiting. 20 tablet 0  . oseltamivir (TAMIFLU) 75 MG capsule Take 1 capsule (75 mg total) by mouth 2 (two) times daily. 10 capsule 0   No current facility-administered medications on file prior to visit.     History reviewed. No pertinent surgical history.  No Known Allergies  Social History   Socioeconomic History  . Marital status: Married    Spouse  name: Not on file  . Number of children: Not on file  . Years of education: Not on file  . Highest education level: Not on file  Social Needs  . Financial resource strain: Not on file  . Food insecurity - worry: Not on file  . Food insecurity - inability: Not on file  . Transportation needs - medical: Not on file  . Transportation needs - non-medical: Not on file  Occupational History  . Not on file  Tobacco Use  . Smoking status: Never Smoker  . Smokeless tobacco: Never Used  Substance and Sexual Activity  . Alcohol use: Yes    Alcohol/week: 0.0 oz    Comment: rare  . Drug use: No  . Sexual activity: Yes    Birth control/protection: Pill  Other Topics Concern  . 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 Hypertension Mother   . Gout Father   . Hypertension Father   . Arthritis Father        s/p THA  . Prostate cancer Father   . Ovarian cysts Sister     BP 108/76   Pulse 93  Ht 5\' 8"  (1.727 m)   Wt 150 lb (68 kg)   BMI 22.81 kg/m   Review of Systems: See HPI above.     Objective:  Physical Exam:  Gen: NAD, comfortable in exam room.  Right shoulder: No swelling, ecchymoses.  No gross deformity. Mild TTP right trapezius. FROM with painful arc. Positive Neers, negative hawkins. Negative Yergasons. Strength 5/5 with empty can and resisted internal/external rotation. Negative apprehension. NV intact distally.   Assessment & Plan:  1. Right shoulder pain - 2/2 rotator cuff impingement.  Not improving with HEP.  Aleve or ibuprofen, start exercises with lighter resistance band.  Nitro patches.  Consider injection, PT if not improving.  F/u in 6 weeks.

## 2017-08-17 NOTE — Patient Instructions (Signed)
You have rotator cuff impingement. Try to avoid painful activities (overhead activities, lifting with extended arm) as much as possible. Aleve 2 tabs twice a day with food OR ibuprofen 3 tabs three times a day with food for pain and inflammation as needed. Can take tylenol in addition to this. Subacromial injection may be beneficial to help with pain and to decrease inflammation. Consider physical therapy with transition to home exercise program. Do home exercise program with theraband and scapular stabilization exercises daily 3 sets of 10 once a day - try 1 sets of 10 to start with and the yellow theraband, every other day and work your way up from here. Nitro patches 1/4th patch to affected shoulder, change daily. Consider injection, physical therapy if still not improving. Follow up with me in 6 weeks.

## 2017-08-17 NOTE — Assessment & Plan Note (Signed)
2/2 rotator cuff impingement.  Not improving with HEP.  Aleve or ibuprofen, start exercises with lighter resistance band.  Nitro patches.  Consider injection, PT if not improving.  F/u in 6 weeks.

## 2017-09-27 ENCOUNTER — Ambulatory Visit: Payer: 59 | Admitting: Family Medicine

## 2017-09-27 ENCOUNTER — Encounter: Payer: Self-pay | Admitting: Family Medicine

## 2017-09-27 DIAGNOSIS — M25511 Pain in right shoulder: Secondary | ICD-10-CM

## 2017-09-27 MED ORDER — METHYLPREDNISOLONE ACETATE 40 MG/ML IJ SUSP
40.0000 mg | Freq: Once | INTRAMUSCULAR | Status: AC
  Administered 2017-09-27: 40 mg via INTRA_ARTICULAR

## 2017-09-28 ENCOUNTER — Ambulatory Visit: Payer: 59 | Admitting: Family Medicine

## 2017-09-28 ENCOUNTER — Encounter: Payer: Self-pay | Admitting: Family Medicine

## 2017-09-28 NOTE — Assessment & Plan Note (Signed)
2/2 rotator cuff impingement.  Patient continues to struggle with pain.  She was not able to tolerate the nitro patches and is difficult for her to do the home exercise program due to pain.  She is continuing to take Aleve.  She was given a subacromial injection today.  Advised we will consider muscular skeletal ultrasound and/or physical therapy if she does not continue to improve.  She will follow-up in 6 weeks but advised to call me in a week to let me know how she is doing.  After informed written consent timeout was performed, patient was seated on exam table. Right shoulder was prepped with alcohol swab and utilizing posterior approach, patient's right subacromial space was injected with 3:1 bupivicaine: depomedrol. Patient tolerated the procedure well without immediate complications.

## 2017-09-28 NOTE — Progress Notes (Signed)
PCP and consultation requested by: Debbrah Alar, NP  Subjective:   HPI: Patient is a 37 y.o. female here for right shoulder pain.  07/01/17: Patient reports she's had about 1 month of lateral right shoulder pain. Bothers worse with reaching across body and holding steering wheel, shoveling. No night pain. Has not been using anything for this though has a heated mattress pad which helps. No acute trauma or injury. Pain currently 1/10 but up to 4/10 and sharp at worst. No skin changes, numbness. Right handed.  08/17/17: Patient reports she's not noticed improvement since last visit. Seemed worse when trying to do home exercises so stopped these. She feels resting has improved her back to where she was pain-wise at last visit. Not taking medications - was taking aleve after visit. Pain 0/10 at rest but up to 5-6/10 and sharp at worst lateral right shoulder. No skin changes, numbness.  3/11: Patient reports she continues to struggle with lateral right shoulder pain that is sharp. Her pain is 6 out of 10 but up to 8-9 out of 10 if she moves her arm around. For example she should get a blanket on Saturday night and had severe pain. She has had trouble sleeping.  Is taking Aleve. Nitroglycerin made her skin itch too much so she stopped this in its her too much for her to do the home exercise program more than for the first couple weeks after she saw [place] on the last visit. No skin changes, numbness.  Past Medical History:  Diagnosis Date  . History of Bell's palsy 1999    Current Outpatient Medications on File Prior to Visit  Medication Sig Dispense Refill  . azithromycin (ZITHROMAX) 250 MG tablet Take 2 tablets by mouth on day 1, followed by 1 tablet by mouth daily for 4 days. 6 tablet 0  . benzonatate (TESSALON) 100 MG capsule Take 1 capsule (100 mg total) by mouth 3 (three) times daily as needed for cough. 21 capsule 0  . levonorgestrel-ethinyl estradiol (SRONYX) 0.1-20  MG-MCG tablet Take 1 tablet by mouth daily.    . nitroGLYCERIN (NITRODUR - DOSED IN MG/24 HR) 0.2 mg/hr patch Apply 1/4th patch to affected shoulder, change daily 30 patch 1  . ondansetron (ZOFRAN) 4 MG tablet Take 1 tablet (4 mg total) by mouth every 8 (eight) hours as needed for nausea or vomiting. 20 tablet 0  . oseltamivir (TAMIFLU) 75 MG capsule Take 1 capsule (75 mg total) by mouth 2 (two) times daily. 10 capsule 0   No current facility-administered medications on file prior to visit.     History reviewed. No pertinent surgical history.  No Known Allergies  Social History   Socioeconomic History  . Marital status: Married    Spouse name: Not on file  . Number of children: Not on file  . Years of education: Not on file  . Highest education level: Not on file  Social Needs  . Financial resource strain: Not on file  . Food insecurity - worry: Not on file  . Food insecurity - inability: Not on file  . Transportation needs - medical: Not on file  . Transportation needs - non-medical: Not on file  Occupational History  . Not on file  Tobacco Use  . Smoking status: Never Smoker  . Smokeless tobacco: Never Used  Substance and Sexual Activity  . Alcohol use: Yes    Alcohol/week: 0.0 oz    Comment: rare  . Drug use: No  . Sexual activity: Yes  Birth control/protection: Pill  Other Topics Concern  . 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 Hypertension Mother   . Gout Father   . Hypertension Father   . Arthritis Father        s/p THA  . Prostate cancer Father   . Ovarian cysts Sister     BP 104/71   Pulse 90   Ht 5\' 8"  (1.727 m)   Wt 151 lb (68.5 kg)   BMI 22.96 kg/m   Review of  Systems: See HPI above.     Objective:  Physical Exam:  Gen: NAD, comfortable in exam room.  Right shoulder: No swelling, ecchymoses.  No gross deformity. No tenderness to palpation right shoulder. Full range of motion with painful arc. Positive Neer's and Hawkins. Negative Yergason's. Strength 5 out of 5 with empty can resisted internal and external rotation. Negative apprehension. Neurovascularly intact distally.   Assessment & Plan:  1. Right shoulder pain - 2/2 rotator cuff impingement.  Patient continues to struggle with pain.  She was not able to tolerate the nitro patches and is difficult for her to do the home exercise program due to pain.  She is continuing to take Aleve.  She was given a subacromial injection today.  Advised we will consider muscular skeletal ultrasound and/or physical therapy if she does not continue to improve.  She will follow-up in 6 weeks but advised to call me in a week to let me know how she is doing.  After informed written consent timeout was performed, patient was seated on exam table. Right shoulder was prepped with alcohol swab and utilizing posterior approach, patient's right subacromial space was injected with 3:1 bupivicaine: depomedrol. Patient tolerated the procedure well without immediate complications.

## 2017-10-11 ENCOUNTER — Ambulatory Visit: Payer: Self-pay

## 2017-10-11 ENCOUNTER — Other Ambulatory Visit: Payer: Self-pay | Admitting: Family Medicine

## 2017-10-11 ENCOUNTER — Ambulatory Visit: Payer: 59 | Admitting: Family Medicine

## 2017-10-11 ENCOUNTER — Encounter: Payer: Self-pay | Admitting: Family Medicine

## 2017-10-11 VITALS — BP 111/71 | HR 96 | Ht 68.0 in | Wt 151.0 lb

## 2017-10-11 DIAGNOSIS — M25511 Pain in right shoulder: Principal | ICD-10-CM

## 2017-10-11 DIAGNOSIS — G8929 Other chronic pain: Secondary | ICD-10-CM | POA: Diagnosis not present

## 2017-10-11 NOTE — Progress Notes (Signed)
PCP and consultation requested by: Debbrah Alar, NP  Subjective:   HPI: Patient is a 37 y.o. female here for right shoulder pain.  07/01/17: Patient reports she's had about 1 month of lateral right shoulder pain. Bothers worse with reaching across body and holding steering wheel, shoveling. No night pain. Has not been using anything for this though has a heated mattress pad which helps. No acute trauma or injury. Pain currently 1/10 but up to 4/10 and sharp at worst. No skin changes, numbness. Right handed.  08/17/17: Patient reports she's not noticed improvement since last visit. Seemed worse when trying to do home exercises so stopped these. She feels resting has improved her back to where she was pain-wise at last visit. Not taking medications - was taking aleve after visit. Pain 0/10 at rest but up to 5-6/10 and sharp at worst lateral right shoulder. No skin changes, numbness.  3/11: Patient reports she continues to struggle with lateral right shoulder pain that is sharp. Her pain is 6 out of 10 but up to 8-9 out of 10 if she moves her arm around. For example she should get a blanket on Saturday night and had severe pain. She has had trouble sleeping.  Is taking Aleve. Nitroglycerin made her skin itch too much so she stopped this in its her too much for her to do the home exercise program more than for the first couple weeks after she saw [place] on the last visit. No skin changes, numbness.  3/25: Patient reports that overall she is doing better compared to 2 weeks ago when she had a subacromial injection. Pain is currently 1 out of 10 but worse with reaching overhead. Pain was a little worse last week. No skin changes or numbness.  Past Medical History:  Diagnosis Date  . History of Bell's palsy 1999    Current Outpatient Medications on File Prior to Visit  Medication Sig Dispense Refill  . azithromycin (ZITHROMAX) 250 MG tablet Take 2 tablets by mouth on day 1,  followed by 1 tablet by mouth daily for 4 days. 6 tablet 0  . benzonatate (TESSALON) 100 MG capsule Take 1 capsule (100 mg total) by mouth 3 (three) times daily as needed for cough. 21 capsule 0  . levonorgestrel-ethinyl estradiol (SRONYX) 0.1-20 MG-MCG tablet Take 1 tablet by mouth daily.    . nitroGLYCERIN (NITRODUR - DOSED IN MG/24 HR) 0.2 mg/hr patch Apply 1/4th patch to affected shoulder, change daily 30 patch 1  . ondansetron (ZOFRAN) 4 MG tablet Take 1 tablet (4 mg total) by mouth every 8 (eight) hours as needed for nausea or vomiting. 20 tablet 0  . oseltamivir (TAMIFLU) 75 MG capsule Take 1 capsule (75 mg total) by mouth 2 (two) times daily. 10 capsule 0   No current facility-administered medications on file prior to visit.     History reviewed. No pertinent surgical history.  No Known Allergies  Social History   Socioeconomic History  . Marital status: Married    Spouse name: Not on file  . Number of children: Not on file  . Years of education: Not on file  . Highest education level: Not on file  Occupational History  . Not on file  Social Needs  . Financial resource strain: Not on file  . Food insecurity:    Worry: Not on file    Inability: Not on file  . Transportation needs:    Medical: Not on file    Non-medical: Not on file  Tobacco Use  . Smoking status: Never Smoker  . Smokeless tobacco: Never Used  Substance and Sexual Activity  . Alcohol use: Yes    Alcohol/week: 0.0 oz    Comment: rare  . Drug use: No  . Sexual activity: Yes    Birth control/protection: Pill  Lifestyle  . Physical activity:    Days per week: Not on file    Minutes per session: Not on file  . Stress: Not on file  Relationships  . Social connections:    Talks on phone: Not on file    Gets together: Not on file    Attends religious service: Not on file    Active member of club or organization: Not on file    Attends meetings of clubs or organizations: Not on file    Relationship  status: Not on file  . Intimate partner violence:    Fear of current or ex partner: Not on file    Emotionally abused: Not on file    Physically abused: Not on file    Forced sexual activity: Not on file  Other Topics Concern  . 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 Hypertension Mother   . Gout Father   . Hypertension Father   . Arthritis Father        s/p THA  . Prostate cancer Father   . Ovarian cysts Sister     BP 111/71   Pulse 96   Ht 5\' 8"  (1.727 m)   Wt 151 lb (68.5 kg)   BMI 22.96 kg/m   Review of Systems: See HPI above.     Objective:  Physical Exam:  Gen: NAD, comfortable in exam room.  Right shoulder: No gross deformity, swelling, bruising. No tenderness to palpation. Full range of motion with painful arc. Strength 5 out of 5 with empty can and resisted internal and external rotation.  Mild pain with empty can and resisted external rotation. Neurovascularly intact distally.   MSK u/s right shoulder: Biceps tendon intact and long and transverse views.  AC joint appears normal without arthropathy or effusion.  Subscapularis is normal.  Infraspinatus is normal also without tears.  Supraspinatus muscle and tendon are normal.  Moderate subacromial bursitis present.  Assessment & Plan:  1. Right shoulder pain -patient's ultrasound was performed today and independently reviewed by me and this is reassuring showing only subacromial bursitis consistent with this and rotator cuff impingement.  She will start physical therapy with transition to home exercise program.  Aleve or ibuprofen if needed.  She states that she did have a mild headache with the nitro patches but feels she may not have  given is a good chance as the headache usually does go away after a few days and she is going to try these again.  We can consider repeating her subacromial injection with ultrasound guidance if she still not improving over the next 6 weeks.  She will follow with [place] at that time.

## 2017-10-11 NOTE — Assessment & Plan Note (Signed)
patient's ultrasound was performed today and independently reviewed by me and this is reassuring showing only subacromial bursitis consistent with this and rotator cuff impingement.  She will start physical therapy with transition to home exercise program.  Aleve or ibuprofen if needed.  She states that she did have a mild headache with the nitro patches but feels she may not have given is a good chance as the headache usually does go away after a few days and she is going to try these again.  We can consider repeating her subacromial injection with ultrasound guidance if she still not improving over the next 6 weeks.  She will follow with [place] at that time.

## 2017-10-11 NOTE — Patient Instructions (Signed)
You have rotator cuff impingement and subacromial bursitis. Your ultrasound is reassuring - you do not have a rotator cuff tear. Try to avoid painful activities (overhead activities, lifting with extended arm) as much as possible. Start physical therapy with transition to home exercise program. Do home exercises on days you don't go to therapy. Aleve 2 tabs twice a day with food OR ibuprofen 3 tabs three times a day with food only if needed. Can take tylenol in addition to this. Nitro patches 1/4th patch to affected shoulder, change daily but if the headaches are too much, don't force yourself to use these. Subacromial injection may be beneficial to help with pain and to decrease inflammation - we can consider repeating this with ultrasound guidance once if still not improving. Follow up with me in 6 weeks.

## 2017-10-11 NOTE — Addendum Note (Signed)
Addended by: Sherrie George F on: 10/11/2017 10:45 AM   Modules accepted: Orders

## 2017-10-19 ENCOUNTER — Ambulatory Visit: Payer: 59 | Admitting: Physical Therapy

## 2018-04-18 ENCOUNTER — Encounter: Payer: Self-pay | Admitting: Family Medicine

## 2018-04-18 ENCOUNTER — Ambulatory Visit: Payer: 59 | Admitting: Family Medicine

## 2018-04-18 VITALS — BP 102/74 | HR 80 | Ht 68.0 in | Wt 160.0 lb

## 2018-04-18 DIAGNOSIS — M25511 Pain in right shoulder: Secondary | ICD-10-CM | POA: Diagnosis not present

## 2018-04-18 MED ORDER — METHYLPREDNISOLONE ACETATE 40 MG/ML IJ SUSP
40.0000 mg | Freq: Once | INTRAMUSCULAR | Status: AC
  Administered 2018-04-18: 40 mg via INTRA_ARTICULAR

## 2018-04-18 NOTE — Progress Notes (Signed)
PCP and consultation requested by: Debbrah Alar, NP  Subjective:   HPI: Patient is a 37 y.o. female here for right shoulder pain.  07/01/17: Patient reports she's had about 1 month of lateral right shoulder pain. Bothers worse with reaching across body and holding steering wheel, shoveling. No night pain. Has not been using anything for this though has a heated mattress pad which helps. No acute trauma or injury. Pain currently 1/10 but up to 4/10 and sharp at worst. No skin changes, numbness. Right handed.  08/17/17: Patient reports she's not noticed improvement since last visit. Seemed worse when trying to do home exercises so stopped these. She feels resting has improved her back to where she was pain-wise at last visit. Not taking medications - was taking aleve after visit. Pain 0/10 at rest but up to 5-6/10 and sharp at worst lateral right shoulder. No skin changes, numbness.  3/11: Patient reports she continues to struggle with lateral right shoulder pain that is sharp. Her pain is 6 out of 10 but up to 8-9 out of 10 if she moves her arm around. For example she should get a blanket on Saturday night and had severe pain. She has had trouble sleeping.  Is taking Aleve. Nitroglycerin made her skin itch too much so she stopped this in its her too much for her to do the home exercise program more than for the first couple weeks after she saw [place] on the last visit. No skin changes, numbness.  3/25: Patient reports that overall she is doing better compared to 2 weeks ago when she had a subacromial injection. Pain is currently 1 out of 10 but worse with reaching overhead. Pain was a little worse last week. No skin changes or numbness.  9/30: Patient returns reporting pain has gotten slowly worse since last visit. Unfortunately her father needed to have bypass surgery so she had to postpone doing physical therapy, was unable to do so. Pain is lateral right shoulder at 5/10  level, sharp. Worse with overhead motions and especially with driving. + night pain. No skin changes, numbness.  Past Medical History:  Diagnosis Date  . History of Bell's palsy 1999    Current Outpatient Medications on File Prior to Visit  Medication Sig Dispense Refill  . levonorgestrel-ethinyl estradiol (SRONYX) 0.1-20 MG-MCG tablet Take 1 tablet by mouth daily.     No current facility-administered medications on file prior to visit.     History reviewed. No pertinent surgical history.  No Known Allergies  Social History   Socioeconomic History  . Marital status: Married    Spouse name: Not on file  . Number of children: Not on file  . Years of education: Not on file  . Highest education level: Not on file  Occupational History  . Not on file  Social Needs  . Financial resource strain: Not on file  . Food insecurity:    Worry: Not on file    Inability: Not on file  . Transportation needs:    Medical: Not on file    Non-medical: Not on file  Tobacco Use  . Smoking status: Never Smoker  . Smokeless tobacco: Never Used  Substance and Sexual Activity  . Alcohol use: Yes    Alcohol/week: 0.0 standard drinks    Comment: rare  . Drug use: No  . Sexual activity: Yes    Birth control/protection: Pill  Lifestyle  . Physical activity:    Days per week: Not on file  Minutes per session: Not on file  . Stress: Not on file  Relationships  . Social connections:    Talks on phone: Not on file    Gets together: Not on file    Attends religious service: Not on file    Active member of club or organization: Not on file    Attends meetings of clubs or organizations: Not on file    Relationship status: Not on file  . Intimate partner violence:    Fear of current or ex partner: Not on file    Emotionally abused: Not on file    Physically abused: Not on file    Forced sexual activity: Not on file  Other Topics Concern  . 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 Hypertension Mother   . Gout Father   . Hypertension Father   . Arthritis Father        s/p THA  . Prostate cancer Father   . Ovarian cysts Sister     BP 102/74   Pulse 80   Ht 5\' 8"  (1.727 m)   Wt 160 lb (72.6 kg)   BMI 24.33 kg/m   Review of Systems: See HPI above.     Objective:  Physical Exam:  Gen: NAD, comfortable in exam room  Right shoulder: No swelling, ecchymoses.  No gross deformity. No TTP. FROM with painful arc. Positive Hawkins, negative Neers. Negative Yergasons. Strength 5/5 with empty can and resisted internal/external rotation.  Pain empty can. Negative apprehension. NV intact distally.  Assessment & Plan:  1. Right shoulder pain - 2/2 subacromial bursitis and rotator cuff impingement noted on previous ultrasound 6 months ago.  She states she thinks she can do physical therapy now - also repeated subacromial injection today.  Aleve or ibuprofen, tylenol if needed.  F/u in 6 weeks.  Consider MRI if not improving.  After informed written consent timeout was performed, patient was seated on exam table. Right shoulder was prepped with alcohol swab and utilizing lateral approach with ultrasound guidance, patient's right subacromial space was injected with 3:1 bupivicaine: depomedrol. Patient tolerated the procedure well without immediate complications.

## 2018-04-18 NOTE — Patient Instructions (Signed)
You have rotator cuff impingement and subacromial bursitis. Try to avoid painful activities (overhead activities, lifting with extended arm) as much as possible. Start physical therapy with transition to home exercise program. Do home exercises on days you don't go to therapy. Aleve 2 tabs twice a day with food OR ibuprofen 3 tabs three times a day with food only if needed. Can take tylenol in addition to this. Subacromial injection may be beneficial to help with pain and to decrease inflammation - we repeated this today. Follow up with me in 6 weeks. If still not improving would go ahead with MRI of this shoulder.

## 2018-04-25 ENCOUNTER — Ambulatory Visit: Payer: 59 | Attending: Family Medicine | Admitting: Physical Therapy

## 2018-04-25 ENCOUNTER — Encounter: Payer: Self-pay | Admitting: Physical Therapy

## 2018-04-25 DIAGNOSIS — M25511 Pain in right shoulder: Secondary | ICD-10-CM | POA: Diagnosis not present

## 2018-04-25 DIAGNOSIS — G8929 Other chronic pain: Secondary | ICD-10-CM | POA: Insufficient documentation

## 2018-04-25 NOTE — Patient Instructions (Signed)

## 2018-04-25 NOTE — Therapy (Signed)
[facility] [address]  [place], [place], [address] Phone: [phone]   Fax:  [phone]  Physical Therapy Evaluation  Patient Details  Name: Chelsea Stafford MRN: [id number] Date of Birth: 02/20/1981 Referring Provider (PT): Karlton Lemon MD   Encounter Date: 04/25/2018  PT End of Session - 04/25/18 1125    Visit Number  1    Number of Visits  8    Date for PT Re-Evaluation  05/30/18    Authorization Type  [employer]    PT Start Time  3825    PT Stop Time  1100    PT Time Calculation (min)  45 min    Activity Tolerance  Patient tolerated treatment well    Behavior During Therapy  [facility] for tasks assessed/performed       Past Medical History:  Diagnosis Date  . History of Bell's palsy 1999    History reviewed. No pertinent surgical history.  There were no vitals filed for this visit.   Subjective Assessment - 04/25/18 1058    Subjective  Pt relays Rt shoulder pain for about 11 months. She had recent injection which helped. MD diagnosed her with subacromial impingement and bursitis seen on [place], and referred her to PT.    Pertinent History  PMH: Bell's Palsy    Limitations  Lifting;House hold activities    Diagnostic tests  [place].    Patient Stated Goals  keep the pain down    Currently in Pain?  Yes    Pain Score  2     Pain Location  Shoulder    Pain Orientation  Right    Pain Descriptors / Indicators  Sharp;Sore    Pain Type  Chronic pain    Pain Radiating Towards  lateral arm    Pain Onset  More than a month ago    Pain Frequency  Intermittent    Aggravating Factors   reaching, lifting away from body,    Pain Relieving Factors  rest, alieve, injection    Multiple Pain Sites  No         [facility] PT Assessment - 04/25/18 0001      Assessment   Medical Diagnosis  Rt shoulder pain, impingement, bursitis    Referring Provider (PT)  Karlton Lemon MD    Onset Date/Surgical Date  --   11 months chronic pain   Next MD Visit  --   6 weeks   Prior Therapy  none      Precautions   Precautions  None      Balance Screen   Has the patient fallen in the past 6 months  No      [place] residence      Prior Function   Level of Independence  Independent      Cognition   Overall Cognitive Status  Within Functional Limits for tasks assessed      Observation/Other Assessments   Focus on Therapeutic Outcomes (FOTO)   39% limited      Sensation   Light Touch  Appears Intact      ROM / Strength   AROM / PROM / Strength  AROM;PROM;Strength      AROM   AROM Assessment Site  Shoulder    Right/Left Shoulder  Right    Right Shoulder Flexion  150 Degrees    Right Shoulder ABduction  110 Degrees    Right Shoulder Internal Rotation  55 Degrees    Right Shoulder External Rotation  --   WFL     PROM   Overall PROM   Within functional limits for tasks performed    Overall PROM Comments  Rt shoulder      Strength   Strength Assessment Site  Shoulder    Right/Left Shoulder  Right    Right Shoulder Flexion  4+/5    Right Shoulder ABduction  4/5    Right Shoulder Internal Rotation  5/5    Right Shoulder External Rotation  4/5      Flexibility   Soft Tissue Assessment /Muscle Length  --   tight pec minor Rt     Palpation   Palpation comment  TTP ant shoulder      Special Tests   Other special tests  + impingment signs, painflul arc                Objective measurements completed on examination: See above findings.      [facility] Adult PT Treatment/Exercise - 04/25/18 0001      Self-Care   Self-Care  --   HEP review     Modalities   Modalities  Cryotherapy;Electrical Stimulation      Cryotherapy   Number Minutes Cryotherapy  15 Minutes    Cryotherapy Location  Shoulder    Type of Cryotherapy  Ice pack      Electrical Stimulation   Electrical Stimulation Location  Rt shoulder    Electrical Stimulation Action  IFC    Electrical  Stimulation Parameters  tolerance 80-150 mhz to 6    Electrical Stimulation Goals  Pain             PT Education - 04/25/18 1121    Education Details  HEP, TENS, POC    Person(s) Educated  Patient    Methods  Explanation;Demonstration;Handout    Comprehension  Verbalized understanding;Need further instruction          PT Long Term Goals - 04/25/18 1151      PT LONG TERM GOAL #1   Title  Pt will be I and compliant with HEP. 5 weeks 05/30/18    Status  New      PT LONG TERM GOAL #2   Title  Pt will improve FOTO to less than 28% limited so show improved function. 5 weeks 05/30/18    Status  New      PT LONG TERM GOAL #3   Title  Pt will increase Rt shoulder AROM to WNL. 5 weeks 05/30/18    Status  New      PT LONG TERM GOAL #4   Title  Pt will increase Rt shoulder strength to 5/5 MMT all planes. 5 weeks 05/30/18    Status  New      PT LONG TERM GOAL #5   Title  Pt will report no more than 1-2 out of 10 with usual activity and housework. 5 weeks 05/30/18    Status  New             Plan - 04/25/18 1125    Clinical Impression Statement  Pt presents with Rt shoulder pain, impingment and bursitis. Symptoms have resolved some after recent injection but she still has increased pain with reaching up, painful arc, decreased AROM, and decreased strength limiting her full functional abilities. She will benefit from skilled PT to address her deficits.     History and Personal Factors relevant to plan of care:  PMH:  Bell's Palsy    Clinical Presentation  Stable    Clinical Decision Making  Low    Rehab Potential  Good    PT Frequency  2x / week    PT Duration  Other (comment)   5 weeks   PT Treatment/Interventions  Cryotherapy;Electrical Stimulation;Iontophoresis 4mg /ml Dexamethasone;Ultrasound;Therapeutic activities;Therapeutic exercise;Neuromuscular re-education;Manual techniques;Passive range of motion;Dry needling;Taping;Vasopneumatic Device;Joint Manipulations    PT  Next Visit Plan  review HEP, RTC and scapular strength and stability, pec stretch, consider MT, modalties like ION[place], [place], TENS, and DN PRN    PT Home Exercise Plan  doorway st, post capsule stretch, IR/ER, wall slides    Consulted and Agree with Plan of Care  Patient       Patient will benefit from skilled therapeutic intervention in order to improve the following deficits and impairments:  Decreased activity tolerance, Decreased range of motion, Decreased endurance, Decreased strength, Impaired flexibility, Pain, Increased fascial restricitons  Visit Diagnosis: Chronic right shoulder pain     Problem List Patient Active Problem List   Diagnosis Date Noted  . Right shoulder pain 07/02/2017  . Preventative health care 08/02/2015  . Paresthesia 06/17/2015    Debbe Odea, PT, DPT 04/25/2018, 11:55 AM  [facility] [address]  [place], [place], [address] Phone: [phone]   Fax:  [phone]  Name: Irean Kendricks MRN: [id number] Date of Birth: June 06, 1981

## 2018-04-29 ENCOUNTER — Ambulatory Visit: Payer: 59 | Admitting: Physical Therapy

## 2018-04-29 DIAGNOSIS — M25511 Pain in right shoulder: Secondary | ICD-10-CM | POA: Diagnosis not present

## 2018-04-29 DIAGNOSIS — G8929 Other chronic pain: Secondary | ICD-10-CM

## 2018-04-29 NOTE — Therapy (Signed)
[facility] [address]  [place], [place], [address] Phone: [phone]   Fax:  [phone]  Physical Therapy Treatment  Patient Details  Name: Chelsea Stafford MRN: [id number] Date of Birth: 10/22/1980 Referring Provider (PT): Karlton Lemon MD   Encounter Date: 04/29/2018  PT End of Session - 04/29/18 1118    Visit Number  2    Number of Visits  8    Date for PT Re-Evaluation  05/30/18    Authorization Type  UHC    PT Start Time  2505    PT Stop Time  1101    PT Time Calculation (min)  46 min    Activity Tolerance  Patient tolerated treatment well    Behavior During Therapy  [facility] for tasks assessed/performed       Past Medical History:  Diagnosis Date  . History of Bell's palsy 1999    No past surgical history on file.  There were no vitals filed for this visit.  Subjective Assessment - 04/29/18 1112    Subjective  Pt relays not really pain just soreness in her shoulder    Currently in Pain?  Yes    Pain Score  1     Pain Location  Shoulder    Pain Orientation  Right    Pain Descriptors / Indicators  Sore                       [facility] Adult PT Treatment/Exercise - 04/29/18 0001      Exercises   Exercises  Shoulder      Shoulder Exercises: Standing   Horizontal ABduction  10 reps    Theraband Level (Shoulder Horizontal ABduction)  Level 1 (Yellow)    External Rotation  Right;15 reps    Theraband Level (Shoulder External Rotation)  Level 2 (Red)    Internal Rotation  Right;20 reps    Theraband Level (Shoulder Internal Rotation)  Level 2 (Red)    Extension  15 reps    Theraband Level (Shoulder Extension)  Level 2 (Red)    Row  15 reps    Theraband Level (Shoulder Row)  Level 2 (Red)    Other Standing Exercises  lower trap/bilat ER yellow X 10      Shoulder Exercises: Pulleys   Flexion  2 minutes    ABduction  2 minutes      Shoulder Exercises: Stretch   Cross Chest Stretch  3  reps;30 seconds    Other Shoulder Stretches  doorway stretch 30 se X 3      Modalities   Modalities  Cryotherapy;Electrical Stimulation;Iontophoresis      Cryotherapy   Number Minutes Cryotherapy  15 Minutes    Cryotherapy Location  Shoulder    Type of Cryotherapy  Ice pack      Electrical Stimulation   Electrical Stimulation Location  Rt shoulder    Electrical Stimulation Action  IFC    Electrical Stimulation Parameters  tolerance    Electrical Stimulation Goals  Pain      Iontophoresis   Type of Iontophoresis  Dexamethasone    Location  Rt ant shoulder    Dose  80 mAmp, 1 mL     Time  STAT patch 4-6 hour wear time      Manual Therapy   Manual therapy comments  STM and Cross friction massage ant shoulder and pec release  PT Education - 04/29/18 1117    Education Details  ionto edu and instructions    Person(s) Educated  Patient    Methods  Explanation    Comprehension  Verbalized understanding          PT Long Term Goals - 04/25/18 1151      PT LONG TERM GOAL #1   Title  Pt will be I and compliant with HEP. 5 weeks 05/30/18    Status  New      PT LONG TERM GOAL #2   Title  Pt will improve FOTO to less than 28% limited so show improved function. 5 weeks 05/30/18    Status  New      PT LONG TERM GOAL #3   Title  Pt will increase Rt shoulder AROM to WNL. 5 weeks 05/30/18    Status  New      PT LONG TERM GOAL #4   Title  Pt will increase Rt shoulder strength to 5/5 MMT all planes. 5 weeks 05/30/18    Status  New      PT LONG TERM GOAL #5   Title  Pt will report no more than 1-2 out of 10 with usual activity and housework. 5 weeks 05/30/18    Status  New            Plan - 04/29/18 1119    Clinical Impression Statement  HEP reviewed with pt with cuing for technique but then she was able to show good return demonstration. She was progressed slighly with scapular strengthening program with good tolerance. MT performed to decrease ST  restrictions. TENS with ice followed by IONTO at end for pain and inflammation.     Rehab Potential  Good    PT Frequency  2x / week    PT Duration  Other (comment)    PT Next Visit Plan  assess response to IONTO,  RTC and scapular strength and stability, pec stretch, consider MT, modalties like IONTO, [place], TENS, and DN PRN    Consulted and Agree with Plan of Care  Patient       Patient will benefit from skilled therapeutic intervention in order to improve the following deficits and impairments:  Decreased activity tolerance, Decreased range of motion, Decreased endurance, Decreased strength, Impaired flexibility, Pain, Increased fascial restricitons  Visit Diagnosis: Chronic right shoulder pain     Problem List Patient Active Problem List   Diagnosis Date Noted  . Right shoulder pain 07/02/2017  . Preventative health care 08/02/2015  . Paresthesia 06/17/2015    Debbe Odea, PT, DPT 04/29/2018, 11:25 AM  [facility] [address]  [place], [place], [address] Phone: [phone]   Fax:  [phone]  Name: Chelsea Stafford MRN: [id number] Date of Birth: May 15, 1981

## 2018-05-02 ENCOUNTER — Ambulatory Visit: Payer: 59 | Admitting: Physical Therapy

## 2018-05-02 DIAGNOSIS — M25511 Pain in right shoulder: Secondary | ICD-10-CM | POA: Diagnosis not present

## 2018-05-02 DIAGNOSIS — G8929 Other chronic pain: Secondary | ICD-10-CM

## 2018-05-02 NOTE — Therapy (Signed)
[facility] [address]  [place], [place], [address] Phone: [phone]   Fax:  [phone]  Physical Therapy Treatment  Patient Details  Name: Chelsea Stafford MRN: [id number] Date of Birth: 10-17-1980 Referring Provider (PT): Karlton Lemon MD   Encounter Date: 05/02/2018  PT End of Session - 05/02/18 1115    Visit Number  3    Number of Visits  8    Date for PT Re-Evaluation  05/30/18    Authorization Type  UHC    PT Start Time  0935    PT Stop Time  1020    PT Time Calculation (min)  45 min    Activity Tolerance  Patient tolerated treatment well    Behavior During Therapy  [facility] for tasks assessed/performed       Past Medical History:  Diagnosis Date  . History of Bell's palsy 1999    No past surgical history on file.  There were no vitals filed for this visit.  Subjective Assessment - 05/02/18 1110    Subjective  Pt relays she is not having as much pain, she is not sure if this is still do to her corizone injection, the IONTO, or if she is making progress with PT    Currently in Pain?  Yes    Pain Score  1     Pain Location  Shoulder    Pain Orientation  Right    Pain Descriptors / Indicators  Sore                       [facility] Adult PT Treatment/Exercise - 05/02/18 0001      Exercises   Exercises  Shoulder      Shoulder Exercises: Supine   Protraction  Other (comment)    Theraband Level (Shoulder Protraction)  --   next visit   Flexion  AROM;20 reps      Shoulder Exercises: Sidelying   External Rotation  AROM;20 reps    ABduction  20 reps;AROM      Shoulder Exercises: Standing   Horizontal ABduction  Strengthening;20 reps    Theraband Level (Shoulder Horizontal ABduction)  Level 2 (Red)    External Rotation  Right;20 reps    Theraband Level (Shoulder External Rotation)  Level 2 (Red)    Internal Rotation  Right;20 reps    Theraband Level (Shoulder Internal Rotation)  Level 2  (Red)    Extension  Strengthening;20 reps    Theraband Level (Shoulder Extension)  Level 2 (Red)    Row  Strengthening;20 reps    Theraband Level (Shoulder Row)  Level 2 (Red)    Other Standing Exercises  lower trap/bilat ER red 2 X 10      Shoulder Exercises: Stretch   Cross Chest Stretch  3 reps;30 seconds   Rt   Other Shoulder Stretches  doorway stretch 30 se X 3      Modalities   Modalities  Cryotherapy;Electrical Stimulation;Iontophoresis      Cryotherapy   Number Minutes Cryotherapy  15 Minutes    Cryotherapy Location  Shoulder    Type of Cryotherapy  Ice pack      Electrical Stimulation   Electrical Stimulation Location  Rt shoulder    Electrical Stimulation Action  IFC    Electrical Stimulation Parameters  tolerance    Electrical Stimulation Goals  Pain      Iontophoresis   Type of Iontophoresis  Dexamethasone  Location  Rt ant shoulder    Dose  80 mAmp, 1 mL     Time  STAT patch 4-6 hour wear time      Manual Therapy   Manual therapy comments  STM and Cross friction massage ant shoulder and pec release                  PT Long Term Goals - 04/25/18 1151      PT LONG TERM GOAL #1   Title  Pt will be I and compliant with HEP. 5 weeks 05/30/18    Status  New      PT LONG TERM GOAL #2   Title  Pt will improve FOTO to less than 28% limited so show improved function. 5 weeks 05/30/18    Status  New      PT LONG TERM GOAL #3   Title  Pt will increase Rt shoulder AROM to WNL. 5 weeks 05/30/18    Status  New      PT LONG TERM GOAL #4   Title  Pt will increase Rt shoulder strength to 5/5 MMT all planes. 5 weeks 05/30/18    Status  New      PT LONG TERM GOAL #5   Title  Pt will report no more than 1-2 out of 10 with usual activity and housework. 5 weeks 05/30/18    Status  New            Plan - 05/02/18 1116    Clinical Impression Statement  Pt able to progress her threx today with reps and resistance along with addition of supine shoulder  flexion, and SL abd and ER to further increase shoulder strenght and stability. She had good tolerance to therex without complaints. She was again treated with ice and TENS followed by IONTO to decreae pain and infllammation as she feels this is helping.     Rehab Potential  Good    PT Frequency  2x / week    PT Duration  Other (comment)   5   PT Treatment/Interventions  Cryotherapy;Electrical Stimulation;Iontophoresis 4mg /ml Dexamethasone;Ultrasound;Therapeutic activities;Therapeutic exercise;Neuromuscular re-education;Manual techniques;Passive range of motion;Dry needling;Taping;Vasopneumatic Device;Joint Manipulations    PT Next Visit Plan  RTC and scapular strength and stability, pec stretch, consider MT, modalties like IONTO, [place], TENS, and DN PRN    PT Home Exercise Plan  doorway st, post capsule stretch, IR/ER, wall slides    Consulted and Agree with Plan of Care  Patient       Patient will benefit from skilled therapeutic intervention in order to improve the following deficits and impairments:  Decreased activity tolerance, Decreased range of motion, Decreased endurance, Decreased strength, Impaired flexibility, Pain, Increased fascial restricitons  Visit Diagnosis: Chronic right shoulder pain     Problem List Patient Active Problem List   Diagnosis Date Noted  . Right shoulder pain 07/02/2017  . Preventative health care 08/02/2015  . Paresthesia 06/17/2015    Debbe Odea, PT, DPT 05/02/2018, 11:19 AM  [facility] [address]  [place], [place], [address] Phone: [phone]   Fax:  [phone]  Name: Chelsea Stafford MRN: [id number] Date of Birth: 1980-10-27

## 2018-05-05 ENCOUNTER — Ambulatory Visit: Payer: 59 | Admitting: Physical Therapy

## 2018-05-05 ENCOUNTER — Encounter: Payer: Self-pay | Admitting: Physical Therapy

## 2018-05-05 DIAGNOSIS — G8929 Other chronic pain: Secondary | ICD-10-CM

## 2018-05-05 DIAGNOSIS — M25511 Pain in right shoulder: Secondary | ICD-10-CM | POA: Diagnosis not present

## 2018-05-05 NOTE — Therapy (Signed)
[facility] [address]  [place], [place], [address] Phone: [phone]   Fax:  [phone]  Physical Therapy Treatment  Patient Details  Name: Chelsea Stafford MRN: [id number] Date of Birth: [birth year] Referring Provider (PT): Karlton Lemon MD   Encounter Date: 05/05/2018  PT End of Session - 05/05/18 0934    Visit Number  4    Number of Visits  8    Date for PT Re-Evaluation  05/30/18    Authorization Type  UHC    PT Start Time  0934    PT Stop Time  1018    PT Time Calculation (min)  44 min    Activity Tolerance  Patient tolerated treatment well    Behavior During Therapy  [facility] for tasks assessed/performed       Past Medical History:  Diagnosis Date  . History of Bell's palsy 1999    History reviewed. No pertinent surgical history.  There were no vitals filed for this visit.  Subjective Assessment - 05/05/18 0936    Subjective  Pt reports she built a bookcase yesterday and was sore afterward, therefore did not do her HEP.    Patient Stated Goals  keep the pain down    Currently in Pain?  Yes    Pain Score  2    "1/10 edging toward a 2/10"   Pain Location  Shoulder    Pain Orientation  Right    Pain Descriptors / Indicators  Sore    Pain Type  Chronic pain    Pain Frequency  Intermittent                       [facility] Adult PT Treatment/Exercise - 05/05/18 0934      Self-Care   Self-Care  Posture    Posture  Discussed optimal posture and shoulder positioning while driving.      Exercises   Exercises  Shoulder      Shoulder Exercises: Supine   Protraction  Right;10 reps;AAROM;Strengthening;Weights   2 sets   Protraction Weight (lbs)  3    Protraction Limitations  2nd set with 3#    Other Supine Exercises  R shoulder circles at 90 dg flexion CW/CCW 2# x10 each      Shoulder Exercises: Sidelying   External Rotation  Right;10 reps;AAROM;Strengthening;Weights    2 sets   External  Rotation Weight (lbs)  2    External Rotation Limitations  2nd set with 2#; cues for scap retraction    ABduction  Right;10 reps;AAROM;Strengthening;Weights   2 sets   ABduction Weight (lbs)  2    ABduction Limitations  2nd set with 2#; cues for scap retraction      Shoulder Exercises: Standing   External Rotation  Right;10 reps;Theraband;Strengthening   2 sets   Theraband Level (Shoulder External Rotation)  Level 2 (Red)    External Rotation Limitations  cues for neutral shoulder with elbow at side & scap retraction - pt having to stop after 10 reps due to fatigue (2nd set completed after IR set performed)    Internal Rotation  Right;20 reps;Theraband;Strengthening    Theraband Level (Shoulder Internal Rotation)  Level 2 (Red)    Internal Rotation Limitations  cues for neutral shoulder with elbow at side & scap retraction    Extension  Both;15 reps;Theraband;Strengthening    Theraband Level (Shoulder Extension)  Level 2 (Red)    Extension Limitations  cues for scap retraction &  depression    Row  Both;15 reps;Theraband;Strengthening    Theraband Level (Shoulder Row)  Level 2 (Red)    Row Limitations  cues for scap retraction & depression, avoiding excessive rotation at shoulders      Shoulder Exercises: ROM/Strengthening   UBE (Upper Arm Bike)  L2.0 x 6 min (3' fwd/3' back)      Shoulder Exercises: Stretch   Other Shoulder Stretches  3 way doorway stretch x 30 sec each - cues to step through doorway rather than leaning/bending forward    Other Shoulder Stretches  R open book stretch 5 x 10"      Manual Therapy   Manual Therapy  Soft tissue mobilization;Myofascial release    Soft tissue mobilization  STM to R LS, UT & pecs    Myofascial Release  manual TPR to R LS & pec major                  PT Long Term Goals - 05/05/18 0939      PT LONG TERM GOAL #1   Title  Pt will be I and compliant with HEP. 5 weeks 05/30/18    Status  On-going      PT LONG TERM GOAL #2    Title  Pt will improve FOTO to less than 28% limited so show improved function. 5 weeks 05/30/18    Status  On-going      PT LONG TERM GOAL #3   Title  Pt will increase Rt shoulder AROM to WNL. 5 weeks 05/30/18    Status  On-going      PT LONG TERM GOAL #4   Title  Pt will increase Rt shoulder strength to 5/5 MMT all planes. 5 weeks 05/30/18    Status  On-going      PT LONG TERM GOAL #5   Title  Pt will report no more than 1-2 out of 10 with usual activity and housework. 5 weeks 05/30/18    Status  On-going            Plan - 05/05/18 [place] admitting to pushing HEP stretches and exercises to the point of pain - cautioned pt that stretches should only be taken to he point where a gentle pull is felt and the stretnghening exercises should only create a mild muscle fatigue type of discomfort, but neither should be painful. She also demonstrates poor postural awareness esp of head and fwd shoudler with R scapula significantly rounded fwd along ribcage - reviewed all exercises emphasizing scapular activation and postural awareness with pt reporting no increased pain but noting that she fatigues much more quickly with some exercises. Pt reporting no icnreased pain by end of session and opting to defer modalities today - wanting to see how she does with just exercises.    Rehab Potential  Good    PT Treatment/Interventions  Cryotherapy;Electrical Stimulation;Iontophoresis 4mg /ml Dexamethasone;Ultrasound;Therapeutic activities;Therapeutic exercise;Neuromuscular re-education;Manual techniques;Passive range of motion;Dry needling;Taping;Vasopneumatic Device;Joint Manipulations    PT Next Visit Plan  RTC and scapular strength and stability, pec stretch, consider MT, modalities like IONTO, [place], TENS, and DN PRN    PT Home Exercise Plan  doorway st, post capsule stretch, IR/ER, wall slides    Consulted and Agree with Plan of Care  Patient       Patient will  benefit from skilled therapeutic intervention in order to improve the following deficits and impairments:  Decreased activity tolerance, Decreased range of motion, Decreased  endurance, Decreased strength, Impaired flexibility, Pain, Increased fascial restricitons  Visit Diagnosis: Chronic right shoulder pain     Problem List Patient Active Problem List   Diagnosis Date Noted  . Right shoulder pain 07/02/2017  . Preventative health care 08/02/2015  . Paresthesia 06/17/2015    Percival Spanish 05/05/2018, 12:49 PM  [facility] [address]  [place], [place], [address] Phone: [phone]   Fax:  [phone]  Name: Chelsea Stafford MRN: [id number] Date of Birth: 05/01/1981

## 2018-05-09 ENCOUNTER — Ambulatory Visit: Payer: 59

## 2018-05-09 DIAGNOSIS — M25511 Pain in right shoulder: Secondary | ICD-10-CM | POA: Diagnosis not present

## 2018-05-09 DIAGNOSIS — G8929 Other chronic pain: Secondary | ICD-10-CM

## 2018-05-09 NOTE — Therapy (Signed)
[facility] [address]  [place], [place], [address] Phone: [phone]   Fax:  [phone]  Physical Therapy Treatment  Patient Details  Name: Chelsea Stafford MRN: [id number] Date of Birth: 03-29-1981 Referring Provider (PT): Karlton Lemon MD   Encounter Date: 05/09/2018  PT End of Session - 05/09/18 0945    Visit Number  5    Number of Visits  8    Date for PT Re-Evaluation  05/30/18    Authorization Type  UHC    PT Start Time  0930    PT Stop Time  1016    PT Time Calculation (min)  46 min    Activity Tolerance  Patient tolerated treatment well    Behavior During Therapy  [facility] for tasks assessed/performed       Past Medical History:  Diagnosis Date  . History of Bell's palsy 1999    No past surgical history on file.  There were no vitals filed for this visit.  Subjective Assessment - 05/09/18 0943    Subjective  Pt. doing well today however notes some increased soreness/stiffness at R shoulder following last visit.      Pertinent History  PMH: Bell's Palsy    Diagnostic tests  [place]    Patient Stated Goals  keep the pain down    Currently in Pain?  No/denies    Pain Score  0-No pain    Multiple Pain Sites  No                       [facility] Adult PT Treatment/Exercise - 05/09/18 0950      Shoulder Exercises: Supine   Protraction  Both;15 reps    Protraction Weight (lbs)  4      Shoulder Exercises: Prone   Retraction  Right;15 reps;Strengthening    Retraction Weight (lbs)  2    Retraction Limitations  Cues for scapular retraction     Flexion  Right;10 reps    Flexion Limitations  Cues for scapular retraction prior to each rep and tactile cueing for proper upward scap. rotation     Extension  Right;10 reps    Extension Weight (lbs)  2    Extension Limitations  Cues for full scapular retraction       Shoulder Exercises: Sidelying   External Rotation  Right;12 reps    External  Rotation Weight (lbs)  2    External Rotation Limitations  2#; Cues for scapular retraction     ABduction  Right;AAROM;Strengthening;Weights;12 reps    ABduction Weight (lbs)  2      Shoulder Exercises: Standing   External Rotation  Right;15 reps;Theraband    Theraband Level (Shoulder External Rotation)  Level 2 (Red)    External Rotation Limitations  VC's for scapular retraction     Internal Rotation  Right;10 reps;Theraband    Theraband Level (Shoulder Internal Rotation)  Level 3 (Green)    Extension  Both;10 reps    Theraband Level (Shoulder Extension)  Level 3 (Green)    Extension Limitations  cues for scap retraction & depression    Row  Both;10 reps    Theraband Level (Shoulder Row)  Level 3 (Green)    Row Limitations  cues for scap retraction & depression, avoiding excessive rotation at shoulders      Shoulder Exercises: ROM/Strengthening   UBE (Upper Arm Bike)  L2.0 x 6 min (3' fwd/3' back)  Shoulder Exercises: Stretch   Other Shoulder Stretches  3 way doorway stretch x 30 sec each - cues to step through doorway rather than leaning/bending forward      Iontophoresis   Type of Iontophoresis  Dexamethasone    Location  Rt ant shoulder    Dose  80 mAmp, 1 mL     Time  4-6 hours   #3/6     Manual Therapy   Manual Therapy  Soft tissue mobilization;Myofascial release    Soft tissue mobilization  STM to R UT in area of tenderness                  PT Long Term Goals - 05/05/18 0939      PT LONG TERM GOAL #1   Title  Pt will be I and compliant with HEP. 5 weeks 05/30/18    Status  On-going      PT LONG TERM GOAL #2   Title  Pt will improve FOTO to less than 28% limited so show improved function. 5 weeks 05/30/18    Status  On-going      PT LONG TERM GOAL #3   Title  Pt will increase Rt shoulder AROM to WNL. 5 weeks 05/30/18    Status  On-going      PT LONG TERM GOAL #4   Title  Pt will increase Rt shoulder strength to 5/5 MMT all planes. 5 weeks  05/30/18    Status  On-going      PT LONG TERM GOAL #5   Title  Pt will report no more than 1-2 out of 10 with usual activity and housework. 5 weeks 05/30/18    Status  On-going            Plan - 05/09/18 0947    Clinical Impression Statement  Pt. reports she feels she is improving with therapy.  Did note she had some increased R shoulder soreness/stiffness following last visit.  Tolerated progression of scapular/RTC strengthening activities well today without increased pain.  Ended visit with ionto patch #3/6 to R lateral/anterior shoulder per pt. request.  Will continue to progress toward goals.      PT Treatment/Interventions  Cryotherapy;Electrical Stimulation;Iontophoresis 4mg /ml Dexamethasone;Ultrasound;Therapeutic activities;Therapeutic exercise;Neuromuscular re-education;Manual techniques;Passive range of motion;Dry needling;Taping;Vasopneumatic Device;Joint Manipulations    Consulted and Agree with Plan of Care  Patient       Patient will benefit from skilled therapeutic intervention in order to improve the following deficits and impairments:  Decreased activity tolerance, Decreased range of motion, Decreased endurance, Decreased strength, Impaired flexibility, Pain, Increased fascial restricitons  Visit Diagnosis: Chronic right shoulder pain     Problem List Patient Active Problem List   Diagnosis Date Noted  . Right shoulder pain 07/02/2017  . Preventative health care 08/02/2015  . Paresthesia 06/17/2015    Bess Harvest, PTA 05/09/18 11:16 AM   [facility] [address]  [place], [place], [address] Phone: [phone]   Fax:  [phone]  Name: Chelsea Stafford MRN: [id number] Date of Birth: 12/04/1980

## 2018-05-09 NOTE — Patient Instructions (Signed)
TENS stands for Transcutaneous Electrical Nerve Stimulation. In other words, electrical impulses are allowed to pass through the skin in order to excite a nerve.   Purpose and Use of TENS:  TENS is a method used to manage acute and chronic pain without the use of drugs. It has been effective in managing pain associated with surgery, sprains, strains, trauma, rheumatoid arthritis, and neuralgias. It is a non-addictive, low risk, and non-invasive technique used to control pain. It is not, by any means, a curative form of treatment.   How TENS Works:  Most TENS units are a small pocket-sized unit powered by one 9 volt battery. Attached to the outside of the unit are two lead wires where two pins and/or snaps connect on each wire. All units come with a set of four reusable pads or electrodes. These are placed on the skin surrounding the area involved. By inserting the leads into  the pads, the electricity can pass from the unit making the circuit complete.  As the intensity is turned up slowly, the electrical current enters the body from the electrodes through the skin to the surrounding nerve fibers. This triggers the release of hormones from within the body. These hormones contain pain relievers. By increasing the circulation of these hormones, the person's pain may be lessened. It is also believed that the electrical stimulation itself helps to block the pain messages being sent to the brain, thus also decreasing the body's perception of pain.   Hazards:  TENS units are NOT to be used by patients with PACEMAKERS, DEFIBRILLATORS, DIABETIC PUMPS, PREGNANT WOMEN, and patients with SEIZURE DISORDERS.  TENS units are NOT to be used over the heart, throat, brain, or spinal cord.  One of the major side effects from the TENS unit may be skin irritation. Some people may develop a rash if they are sensitive to the materials used in the electrodes or the connecting wires.   Wear the unit for 15'.   Avoid overuse  due the body getting used to the stem making it not as effective over time.    

## 2018-05-12 ENCOUNTER — Ambulatory Visit: Payer: 59

## 2018-05-12 DIAGNOSIS — G8929 Other chronic pain: Secondary | ICD-10-CM

## 2018-05-12 DIAGNOSIS — M25511 Pain in right shoulder: Secondary | ICD-10-CM | POA: Diagnosis not present

## 2018-05-12 NOTE — Therapy (Signed)
[facility] [address]  [place], [place], [address] Phone: [phone]   Fax:  [phone]  Physical Therapy Treatment  Patient Details  Name: Chelsea Stafford MRN: [id number] Date of Birth: 05-Apr-1981 Referring Provider (PT): Karlton Lemon MD   Encounter Date: 05/12/2018  PT End of Session - 05/12/18 0939    Visit Number  6    Number of Visits  8    Date for PT Re-Evaluation  05/30/18    Authorization Type  UHC    PT Start Time  0934    PT Stop Time  1015    PT Time Calculation (min)  41 min    Activity Tolerance  Patient tolerated treatment well    Behavior During Therapy  [facility] for tasks assessed/performed       Past Medical History:  Diagnosis Date  . History of Bell's palsy 1999    No past surgical history on file.  There were no vitals filed for this visit.  Subjective Assessment - 05/12/18 0938    Subjective  Pt. noting no soreness after last visit.  Notes she feels ionto patch may have helped reduce pain.      Pertinent History  PMH: Bell's Palsy    Patient Stated Goals  keep the pain down    Currently in Pain?  No/denies    Pain Score  0-No pain    Multiple Pain Sites  No                       [facility] Adult PT Treatment/Exercise - 05/12/18 0943      Shoulder Exercises: Prone   Flexion  Both;10 reps    Flexion Limitations  prone "Y" on green p-ball     Extension  Both;10 reps    Extension Limitations  prone "I's"on green p-ball    External Rotation  Both;10 reps;Strengthening    External Rotation Limitations  Prone "W's" on green p-ball     Horizontal ABduction 1  10 reps;Both;Strengthening    Horizontal ABduction 1 Limitations  Prone "T's on green p-ball       Shoulder Exercises: Sidelying   External Rotation  Right;10 reps    External Rotation Weight (lbs)  3    External Rotation Limitations  3#; Cues for scapular retraction    fatigue noted after set however no pain    ABduction  Right;AAROM;Strengthening;Weights;15 reps    ABduction Weight (lbs)  2    ABduction Limitations  manual guidance for upward scapular rotation       Shoulder Exercises: ROM/Strengthening   UBE (Upper Arm Bike)  L2.0 x 6 min (3' fwd/3' back)    Lat Pull  15 reps    Lat Pull Limitations  15# - wide grip focusing on scapular retraction/depression     Cybex Row  15 reps    Cybex Row Limitations  15# - low handles       Shoulder Exercises: Stretch   Other Shoulder Stretches  3 way doorway stretch x 30 sec each - cues to step through doorway rather than leaning/bending forward             PT Education - 05/12/18 1308    Education Details  HEP update: red TB issued to pt. for row    Person(s) Educated  Patient    Methods  Explanation;Demonstration;Verbal cues    Comprehension  Verbalized understanding;Returned demonstration;Verbal cues required;Need further instruction  PT Long Term Goals - 05/05/18 0939      PT LONG TERM GOAL #1   Title  Pt will be I and compliant with HEP. 5 weeks 05/30/18    Status  On-going      PT LONG TERM GOAL #2   Title  Pt will improve FOTO to less than 28% limited so show improved function. 5 weeks 05/30/18    Status  On-going      PT LONG TERM GOAL #3   Title  Pt will increase Rt shoulder AROM to WNL. 5 weeks 05/30/18    Status  On-going      PT LONG TERM GOAL #4   Title  Pt will increase Rt shoulder strength to 5/5 MMT all planes. 5 weeks 05/30/18    Status  On-going      PT LONG TERM GOAL #5   Title  Pt will report no more than 1-2 out of 10 with usual activity and housework. 5 weeks 05/30/18    Status  On-going            Plan - 05/12/18 1258    Clinical Impression Statement  Pt. noting some benefit from ionto applied last visit.  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  Tolerated progression  of scapular strengthening well today with focus on anterior chest stretching as to improve scapulohumeral mechanics to improve tolerance for daily activities.  HEP updated.  Will continue to progress toward goals.      PT Treatment/Interventions  Cryotherapy;Electrical Stimulation;Iontophoresis 4mg /ml Dexamethasone;Ultrasound;Therapeutic activities;Therapeutic exercise;Neuromuscular re-education;Manual techniques;Passive range of motion;Dry needling;Taping;Vasopneumatic Device;Joint Manipulations    PT Home Exercise Plan  doorway st, post capsule stretch, IR/ER, wall slides    Consulted and Agree with Plan of Care  Patient       Patient will benefit from skilled therapeutic intervention in order to improve the following deficits and impairments:  Decreased activity tolerance, Decreased range of motion, Decreased endurance, Decreased strength, Impaired flexibility, Pain, Increased fascial restricitons  Visit Diagnosis: Chronic right shoulder pain     Problem List Patient Active Problem List   Diagnosis Date Noted  . Right shoulder pain 07/02/2017  . Preventative health care 08/02/2015  . Paresthesia 06/17/2015    Bess Harvest, PTA 05/12/18 1:09 PM   [facility] [address]  [place], [place], [address] Phone: [phone]   Fax:  [phone]  Name: Chelsea Stafford MRN: [id number] Date of Birth: [birth year]

## 2018-05-16 ENCOUNTER — Encounter: Payer: 59 | Admitting: Physical Therapy

## 2018-05-17 ENCOUNTER — Ambulatory Visit: Payer: 59

## 2018-05-17 DIAGNOSIS — G8929 Other chronic pain: Secondary | ICD-10-CM

## 2018-05-17 DIAGNOSIS — M25511 Pain in right shoulder: Secondary | ICD-10-CM | POA: Diagnosis not present

## 2018-05-17 NOTE — Therapy (Signed)
[facility] [address]  [place], [place], [address] Phone: [phone]   Fax:  [phone]  Physical Therapy Treatment  Patient Details  Name: Kenneisha Cochrane MRN: [id number] Date of Birth: 1981-07-08 Referring Provider (PT): Karlton Lemon MD   Encounter Date: 05/17/2018  PT End of Session - 05/17/18 1124    Visit Number  7    Number of Visits  8    Date for PT Re-Evaluation  05/30/18    Authorization Type  UHC    PT Start Time  1100    PT Stop Time  1150    PT Time Calculation (min)  50 min    Activity Tolerance  Patient tolerated treatment well    Behavior During Therapy  [facility] for tasks assessed/performed       Past Medical History:  Diagnosis Date  . History of Bell's palsy 1999    No past surgical history on file.  There were no vitals filed for this visit.  Subjective Assessment - 05/17/18 1107    Subjective  noting some increased pain over weekend with reaching activities and is afraid this is due to cortisone injection, "wearing off".    Pertinent History  PMH: Bell's Palsy    Diagnostic tests  [facility].S.    Patient Stated Goals  keep the pain down    Currently in Pain?  No/denies    Pain Score  0-No pain   with rolling shoulder earlier this morning 2-3/10    Multiple Pain Sites  No                       [facility] Adult PT Treatment/Exercise - 05/17/18 1114      Shoulder Exercises: Supine   Horizontal ABduction  Both;10 reps    Theraband Level (Shoulder Horizontal ABduction)  Level 2 (Red)    Horizontal ABduction Limitations  Hooklying on pool noodle     External Rotation  Both;15 reps    Theraband Level (Shoulder External Rotation)  Level 1 (Yellow)    External Rotation Limitations  Hooklying on pool noodle    Flexion  Right;Left;10 reps    Theraband Level (Shoulder Flexion)  Level 2 (Red)    Flexion Limitations  Alternating flexion/ext. with red TB       Shoulder Exercises: Prone    Flexion  Both;12 reps    Flexion Limitations  prone "Y" on green p-ball     Extension  Both;10 reps    Extension Weight (lbs)  1    Extension Limitations  prone "I's"on green p-ball    External Rotation  Both;Strengthening;12 reps    External Rotation Limitations  Prone "W's" on green p-ball     Horizontal ABduction 1  10 reps;Both;Strengthening    Horizontal ABduction 1 Weight (lbs)  1    Horizontal ABduction 1 Limitations  Prone "T's on green p-ball       Shoulder Exercises: Standing   External Rotation  Right;15 reps;Theraband    Theraband Level (Shoulder External Rotation)  Level 2 (Red)    External Rotation Limitations  VC's for scapular retraction     Internal Rotation  15 reps;Theraband    Theraband Level (Shoulder Internal Rotation)  Level 3 (Green)    Internal Rotation Limitations  cues for neutral shoulder with elbow at side & scap retraction      Shoulder Exercises: ROM/Strengthening   UBE (Upper Arm Bike)  L2.0 x 6 min (3' fwd/3'  back)      Iontophoresis   Type of Iontophoresis  Dexamethasone    Location  Rt ant shoulder    Dose  80 mAmp, 1 mL     Time  4-6 hours   #4/6     Manual Therapy   Manual Therapy  Soft tissue mobilization;Passive ROM    Soft tissue mobilization  STM to R UT in area of tenderness    Myofascial Release  Manual TPR to R UT    Passive ROM  Manual R UT, LS stretch with therapist x 30 sec each way; B pec stretch Hooklying on pool noodle with STM/overpressure from therapist in cross-position x 2 min                   PT Long Term Goals - 05/05/18 0939      PT LONG TERM GOAL #1   Title  Pt will be I and compliant with HEP. 5 weeks 05/30/18    Status  On-going      PT LONG TERM GOAL #2   Title  Pt will improve FOTO to less than 28% limited so show improved function. 5 weeks 05/30/18    Status  On-going      PT LONG TERM GOAL #3   Title  Pt will increase Rt shoulder AROM to WNL. 5 weeks 05/30/18    Status  On-going      PT LONG  TERM GOAL #4   Title  Pt will increase Rt shoulder strength to 5/5 MMT all planes. 5 weeks 05/30/18    Status  On-going      PT LONG TERM GOAL #5   Title  Pt will report no more than 1-2 out of 10 with usual activity and housework. 5 weeks 05/30/18    Status  On-going            Plan - 05/17/18 1140    Clinical Impression Statement  Pt. noting she fears R shoulder injection is wearing off that she received ~ 1 month ago.  Has had some increased R shoulder pain with reaching activities over weekend.  Is also unsure if this is attributed to not using ionto patch last visit.  Tolerated mild progression of scapular strengthening activities in session today well.  Manual therapy focused on STM/passive stretching of ongoing tight pec, upper shoulder musculature for reduction in tenderness and improvement in shoulder mechanics with daily activities.   Ended visit with ionto patch #4/6 to R shoulder per pt. request as she has had relief from this in past visits.      PT Treatment/Interventions  Cryotherapy;Electrical Stimulation;Iontophoresis 4mg /ml Dexamethasone;Ultrasound;Therapeutic activities;Therapeutic exercise;Neuromuscular re-education;Manual techniques;Passive range of motion;Dry needling;Taping;Vasopneumatic Device;Joint Manipulations    Consulted and Agree with Plan of Care  Patient       Patient will benefit from skilled therapeutic intervention in order to improve the following deficits and impairments:  Decreased activity tolerance, Decreased range of motion, Decreased endurance, Decreased strength, Impaired flexibility, Pain, Increased fascial restricitons  Visit Diagnosis: Chronic right shoulder pain     Problem List Patient Active Problem List   Diagnosis Date Noted  . Right shoulder pain 07/02/2017  . Preventative health care 08/02/2015  . Paresthesia 06/17/2015    Bess Harvest, PTA 05/17/18 12:43 PM   [facility] [address]  [place], [place], [address] Phone: [phone]   Fax:  [phone]  Name: Juliany Daughety MRN: [id number] Date of Birth:  01/22/1981   

## 2018-05-19 ENCOUNTER — Encounter: Payer: 59 | Admitting: Physical Therapy

## 2018-05-23 ENCOUNTER — Ambulatory Visit: Payer: 59 | Attending: Family Medicine | Admitting: Physical Therapy

## 2018-05-23 ENCOUNTER — Encounter: Payer: Self-pay | Admitting: Physical Therapy

## 2018-05-23 DIAGNOSIS — R252 Cramp and spasm: Secondary | ICD-10-CM

## 2018-05-23 DIAGNOSIS — G8929 Other chronic pain: Secondary | ICD-10-CM | POA: Diagnosis not present

## 2018-05-23 DIAGNOSIS — M25511 Pain in right shoulder: Secondary | ICD-10-CM | POA: Insufficient documentation

## 2018-05-23 NOTE — Patient Instructions (Signed)

## 2018-05-23 NOTE — Therapy (Signed)
[facility] [address]  [place], [place], [address] Phone: [phone]   Fax:  [phone]  Physical Therapy Treatment  Patient Details  Name: Chelsea Stafford MRN: [id number] Date of Birth: 1980/11/16 Referring Provider (PT): Karlton Lemon MD   Encounter Date: 05/23/2018  PT End of Session - 05/23/18 0934    Visit Number  8    Number of Visits  12    Date for PT Re-Evaluation  06/20/18    Authorization Type  UHC    PT Start Time  0934    PT Stop Time  1022    PT Time Calculation (min)  48 min    Activity Tolerance  Patient tolerated treatment well    Behavior During Therapy  [facility] for tasks assessed/performed       Past Medical History:  Diagnosis Date  . History of Bell's palsy 1999    History reviewed. No pertinent surgical history.  There were no vitals filed for this visit.  Subjective Assessment - 05/23/18 0939    Subjective  Pt noting relief in intensity of pain but still notes lingering pain for ~15 minutes after she does something that triggers the pain.    Pertinent History  PMH: Bell's Palsy    Diagnostic tests  [facility].S.    Patient Stated Goals  keep the pain down    Currently in Pain?  No/denies    Pain Score  0-No pain   briefly sharp pain up to 0/62 with certain activities, followed by 2/10 throbbing pain for ~15 minutes   Pain Location  Shoulder    Pain Orientation  Right;Upper    Pain Descriptors / Indicators  Sharp;Throbbing    Pain Type  Chronic pain    Pain Radiating Towards  none recently    Pain Onset  More than a month ago    Pain Frequency  Intermittent    Aggravating Factors   rolling shoulders, shaking out a blanket, reaching/pulling from out to side (closing car door)    Pain Relieving Factors  rest    Effect of Pain on Daily Activities  uses L arm out of habit to avoid triggering pain         [facility] PT Assessment - 05/23/18 0934      Assessment   Medical Diagnosis  Rt shoulder  pain, impingement, bursitis    Referring Provider (PT)  Karlton Lemon MD    Onset Date/Surgical Date  --   ~1 yr - chronic pain   Next MD Visit  05/30/18      Observation/Other Assessments   Focus on Therapeutic Outcomes (FOTO)   Shoulder - 75% (25% limitation)      AROM   Right Shoulder Flexion  156 Degrees    Right Shoulder ABduction  158 Degrees    Right Shoulder Internal Rotation  80 Degrees   FIR to lower angle of scapula   Right Shoulder External Rotation  --   WFL   Left Shoulder Flexion  168 Degrees    Left Shoulder ABduction  170 Degrees    Left Shoulder Internal Rotation  --   FIR to mid scapula   Left Shoulder External Rotation  --   [facility]     Strength   Right Shoulder Flexion  4+/5    Right Shoulder ABduction  4+/5    Right Shoulder Internal Rotation  5/5    Right Shoulder External Rotation  4+/5    Left Shoulder  Flexion  5/5    Left Shoulder ABduction  5/5    Left Shoulder Internal Rotation  5/5    Left Shoulder External Rotation  5/5                   [facility] Adult PT Treatment/Exercise - 05/23/18 0934      Exercises   Exercises  Shoulder      Shoulder Exercises: Supine   Diagonals  Both;10 reps;Theraband;Strengthening    Theraband Level (Shoulder Diagonals)  Level 2 (Red)    Diagonals Limitations  hoolying on 6" FR      Shoulder Exercises: Prone   Extension  Both;10 reps    Extension Limitations  I's on green Pball     External Rotation  Both;10 reps    External Rotation Limitations  W's on green Pball     Horizontal ABduction 1  Both;10 reps    Horizontal ABduction 1 Limitations  T's on green Pball     Horizontal ABduction 2  Both;10 reps    Horizontal ABduction 2 Limitations  Y's on green Pball - decreased scap retraction      Shoulder Exercises: ROM/Strengthening   UBE (Upper Arm Bike)  L2.0 x 6 min (3' fwd/3' back)      Manual Therapy   Manual Therapy  Soft tissue mobilization;Myofascial release;Other (comment)    Soft tissue  mobilization  STM to R UT & LS    Myofascial Release  Manual TPR to R UT    Other Manual Therapy  Instructed pt in self-release techniques for UT TP with Theracane or tennis ball on wall             PT Education - 05/23/18 1022    Education Details  Role of DN in managing ongoing TPs in UT    Person(s) Educated  Patient    Methods  Explanation;Handout    Comprehension  Verbalized understanding;Need further instruction          PT Long Term Goals - 05/23/18 0947      PT LONG TERM GOAL #1   Title  Pt will be I and compliant with HEP.     Status  Partially Met    Target Date  06/20/18      PT LONG TERM GOAL #2   Title  Pt will improve FOTO to less than 28% limited so show improved function.     Status  Achieved      PT LONG TERM GOAL #3   Title  Pt will increase Rt shoulder AROM to WNL.     Status  Partially Met    Target Date  06/20/18      PT LONG TERM GOAL #4   Title  Pt will increase Rt shoulder strength to 5/5 MMT all planes.    Status  Partially Met    Target Date  06/20/18      PT LONG TERM GOAL #5   Title  Pt will report no more than 1-2 out of 10 with usual activity and housework.     Status  On-going    Target Date  06/20/18            Plan - 05/23/18 [place] noting some decrease in pain frequency and intensity since start of PT, but still experiences pain with most activities where she shrugs or rolls her R shoulder or reaches/lifts any weight overhead - pain briefly sharp at onset, then  lingers as a less intense throbbing pain for ~15 minutes following onset of pain. Pt reports no concerns with current HEP other than uncertain if she is performing prone Pball exercises correctly (reviewed for proper technique with only minor cues necessary), but admits to intermittent compliance. R shoulder AROM considerably improved since start of PT, but still lacking relative to L. B shoulder strength nearly equivalent with R  shoulder only minimally weaker than L. FOTO also revealing improvement with only 25% limitation at present (goal met). Remaining goals mostly partially met. Given initial progress with PT but ongoing deficits, recommend recert for additonal 4 wks at 1x/wk.     Rehab Potential  Good    PT Frequency  1x / week    PT Duration  4 weeks    PT Treatment/Interventions  Cryotherapy;Electrical Stimulation;Iontophoresis 15m/ml Dexamethasone;Ultrasound;Therapeutic activities;Therapeutic exercise;Neuromuscular re-education;Manual techniques;Passive range of motion;Dry needling;Taping;Vasopneumatic Device;Joint Manipulations    Consulted and Agree with Plan of Care  Patient       Patient will benefit from skilled therapeutic intervention in order to improve the following deficits and impairments:  Decreased activity tolerance, Decreased range of motion, Decreased endurance, Decreased strength, Impaired flexibility, Pain, Increased fascial restricitons  Visit Diagnosis: Chronic right shoulder pain  Cramp and spasm     Problem List Patient Active Problem List   Diagnosis Date Noted  . Right shoulder pain 07/02/2017  . Preventative health care 08/02/2015  . Paresthesia 06/17/2015    JPercival Spanish PT, MPT 05/23/2018, 12:59 PM  [facility] [place] [address]Phone: [phone]  Fax:  [phone] Name: Chelsea EduardoMRN: [id number] of Birth: [birth year]

## 2018-05-26 ENCOUNTER — Encounter: Payer: 59 | Admitting: Physical Therapy

## 2018-05-30 ENCOUNTER — Encounter: Payer: Self-pay | Admitting: Family Medicine

## 2018-05-30 ENCOUNTER — Ambulatory Visit: Payer: 59

## 2018-05-30 ENCOUNTER — Ambulatory Visit: Payer: 59 | Admitting: Family Medicine

## 2018-05-30 VITALS — BP 111/76 | HR 86 | Ht 68.0 in | Wt 160.0 lb

## 2018-05-30 DIAGNOSIS — G8929 Other chronic pain: Secondary | ICD-10-CM

## 2018-05-30 DIAGNOSIS — M25511 Pain in right shoulder: Secondary | ICD-10-CM

## 2018-05-30 DIAGNOSIS — R252 Cramp and spasm: Secondary | ICD-10-CM | POA: Diagnosis not present

## 2018-05-30 NOTE — Therapy (Signed)
[facility] [address]  [place], [place], [address] Phone: [phone]   Fax:  [phone]  Physical Therapy Treatment  Patient Details  Name: Chelsea Stafford MRN: [id number] Date of Birth: Jul 06, 1981 Referring Provider (PT): Karlton Lemon MD   Encounter Date: 05/30/2018  PT End of Session - 05/30/18 0859    Visit Number  9    Number of Visits  12    Date for PT Re-Evaluation  06/20/18    Authorization Type  UHC    PT Start Time  0850    PT Stop Time  0930    PT Time Calculation (min)  40 min    Activity Tolerance  Patient tolerated treatment well    Behavior During Therapy  [facility] for tasks assessed/performed       Past Medical History:  Diagnosis Date  . History of Bell's palsy 1999    No past surgical history on file.  There were no vitals filed for this visit.  Subjective Assessment - 05/30/18 0852    Subjective  Pt. noting less frequent and less intense pain at this point.  Reports, "I feel like it's finally getting better".      Pertinent History  PMH: Bell's Palsy    Diagnostic tests  [place]    Patient Stated Goals  keep the pain down    Currently in Pain?  No/denies    Pain Score  --   up to 2/10    Pain Location  Shoulder    Pain Orientation  Right;Upper    Pain Descriptors / Indicators  Sharp;Throbbing    Pain Type  Chronic pain    Pain Onset  More than a month ago    Pain Frequency  Intermittent    Aggravating Factors   Shrugging (improved however still painful intermittently), reaching behind to back seat in car    Pain Relieving Factors  rest     Multiple Pain Sites  No         [facility] PT Assessment - 05/30/18 0910      AROM   Right Shoulder Flexion  158 Degrees    Right Shoulder ABduction  160 Degrees    Right Shoulder Internal Rotation  --   FIR to inferior angle of scapula   Right Shoulder External Rotation  --   FER [facility]     Strength   Strength Assessment Site  Shoulder    Right/Left Shoulder  Right    Right Shoulder Flexion  5/5    Right Shoulder ABduction  4+/5    Right Shoulder Internal Rotation  5/5    Right Shoulder External Rotation  4+/5                   [facility] Adult PT Treatment/Exercise - 05/30/18 0900      Shoulder Exercises: Prone   Retraction  Right;15 reps;Strengthening    Retraction Weight (lbs)  3    Retraction Limitations  3" scapular retraction hold with cues to avoid excessive scap. elevation     Flexion  Right;10 reps    Flexion Limitations  prone "Y" on on mat table     Extension  Right;10 reps    Extension Weight (lbs)  1    Extension Limitations  I's prone on mat table     Horizontal ABduction 1  Right;10 reps    Horizontal ABduction 1 Weight (lbs)  1    Horizontal ABduction 1 Limitations  T's prone on mat table       Shoulder Exercises: Standing   External Rotation  Right;15 reps;Theraband    Theraband Level (Shoulder External Rotation)  Level 2 (Red)    External Rotation Limitations  VC's for scapular retraction     Extension  Both;12 reps    Theraband Level (Shoulder Extension)  Level 3 (Green)    Extension Limitations  cues for scap retraction & depression    Row  Both;12 reps    Theraband Level (Shoulder Row)  Level 3 (Green)    Row Limitations  cues for scap retraction & depression, avoiding excessive rotation at shoulders      Shoulder Exercises: ROM/Strengthening   UBE (Upper Arm Bike)  L2.0 x 6 min (3' fwd/3' back)      Shoulder Exercises: Stretch   Other Shoulder Stretches  mid and high doorway stretch 2 x 30 sec each     Other Shoulder Stretches  R shoulder towel stretch for IR 5" x 5 reps    well tolerated      Manual Therapy   Manual Therapy  Soft tissue mobilization;Myofascial release    Manual therapy comments  seated     Soft tissue mobilization  STM to R UT & LS    Myofascial Release  Manual TPR to mid R UT             PT Education - 05/30/18 0948    Education Details  HEP  update: green TB issued to pt. for row, and row extension     Person(s) Educated  Patient    Methods  Explanation;Demonstration;Verbal cues;Handout    Comprehension  Verbalized understanding;Returned demonstration;Verbal cues required;Need further instruction          PT Long Term Goals - 05/30/18 0904      PT LONG TERM GOAL #1   Title  Pt will be I and compliant with HEP.     Status  Partially Met      PT LONG TERM GOAL #2   Title  Pt will improve FOTO to less than 28% limited so show improved function.     Status  Achieved      PT LONG TERM GOAL #3   Title  Pt will increase Rt shoulder AROM to WNL.     Status  Partially Met      PT LONG TERM GOAL #4   Title  Pt will increase Rt shoulder strength to 5/5 MMT all planes.    Status  Partially Met      PT LONG TERM GOAL #5   Title  Pt will report no more than 1-2 out of 10 with usual activity and housework.     Status  Achieved            Plan - 05/30/18 0905    Clinical Impression Statement  Pt. making progress with therapy.  Reports R shoulder pain is less frequent and less intense over this past week however still having some pain with reaching to back seat in car at this point.  Able to demo improvement in R shoulder flexion strength with MMT today.  Pt. has met or partially met all LTG's and is on track to meet remaining LTG's by end of POC.  HEP updated today to target ROM deficits and scapular strengthening HEP progressed to green TB today issued to pt.  Will continue to progress toward goals.      PT Treatment/Interventions  Cryotherapy;Electrical Stimulation;Iontophoresis 38m/ml Dexamethasone;Ultrasound;Therapeutic  activities;Therapeutic exercise;Neuromuscular re-education;Manual techniques;Passive range of motion;Dry needling;Taping;Vasopneumatic Device;Joint Manipulations    PT Home Exercise Plan  doorway st, post capsule stretch, IR/ER, wall slides, row (green TB), B extension (green TB), Prone I's, T's, Y's (pt.  standing now with Y's alternating X)    Consulted and Agree with Plan of Care  Patient       Patient will benefit from skilled therapeutic intervention in order to improve the following deficits and impairments:  Decreased activity tolerance, Decreased range of motion, Decreased endurance, Decreased strength, Impaired flexibility, Pain, Increased fascial restricitons  Visit Diagnosis: Chronic right shoulder pain  Cramp and spasm     Problem List Patient Active Problem List   Diagnosis Date Noted  . Right shoulder pain 07/02/2017  . Preventative health care 08/02/2015  . Paresthesia 06/17/2015    Bess Harvest, PTA 05/30/18 9:49 AM   [facility] [address]  [place], [place], [address] Phone: [phone]   Fax:  [phone]  Name: Canaan Prue MRN: [id number] Date of Birth: 18-Jan-1981

## 2018-05-30 NOTE — Progress Notes (Addendum)
PCP and consultation requested by: Debbrah Alar, NP  Subjective:   HPI: Patient is a 37 y.o. female here for right shoulder pain.  07/01/17: Patient reports she's had about 1 month of lateral right shoulder pain. Bothers worse with reaching across body and holding steering wheel, shoveling. No night pain. Has not been using anything for this though has a heated mattress pad which helps. No acute trauma or injury. Pain currently 1/10 but up to 4/10 and sharp at worst. No skin changes, numbness. Right handed.  08/17/17: Patient reports she's not noticed improvement since last visit. Seemed worse when trying to do home exercises so stopped these. She feels resting has improved her back to where she was pain-wise at last visit. Not taking medications - was taking aleve after visit. Pain 0/10 at rest but up to 5-6/10 and sharp at worst lateral right shoulder. No skin changes, numbness.  3/11: Patient reports she continues to struggle with lateral right shoulder pain that is sharp. Her pain is 6 out of 10 but up to 8-9 out of 10 if she moves her arm around. For example she should get a blanket on Saturday night and had severe pain. She has had trouble sleeping.  Is taking Aleve. Nitroglycerin made her skin itch too much so she stopped this in its her too much for her to do the home exercise program more than for the first couple weeks after she saw [place] on the last visit. No skin changes, numbness.  3/25: Patient reports that overall she is doing better compared to 2 weeks ago when she had a subacromial injection. Pain is currently 1 out of 10 but worse with reaching overhead. Pain was a little worse last week. No skin changes or numbness.  9/30: Patient returns reporting pain has gotten slowly worse since last visit. Unfortunately her father needed to have bypass surgery so she had to postpone doing physical therapy, was unable to do so. Pain is lateral right shoulder at 5/10  level, sharp. Worse with overhead motions and especially with driving. + night pain. No skin changes, numbness  11/11: Patient returns for right shoulder pain.  At last visit she received a subacromial steroid injection.  She reports having some increased pain immediately following this and only about 2 weeks of improvement.  Despite this, she notes 70% improvement with her physical therapy.  She today has 1/10 pain.  She continues to have pain with reaching behind her in a car, if rolling her shoulder, or doing push-ups.  She has been able to resume participation in karate.  She has 3 weeks remaining physical therapy.  No new concerning symptoms of numbness or tingling.  No swelling or erythema.  No skin changes  Past Medical History:  Diagnosis Date  . History of Bell's palsy 1999    Current Outpatient Medications on File Prior to Visit  Medication Sig Dispense Refill  . levonorgestrel-ethinyl estradiol (SRONYX) 0.1-20 MG-MCG tablet Take 1 tablet by mouth daily.     No current facility-administered medications on file prior to visit.     No past surgical history on file.  No Known Allergies  Social History   Socioeconomic History  . Marital status: Married    Spouse name: Not on file  . Number of children: Not on file  . Years of education: Not on file  . Highest education level: Not on file  Occupational History  . Not on file  Social Needs  . Financial resource strain: Not  on file  . Food insecurity:    Worry: Not on file    Inability: Not on file  . Transportation needs:    Medical: Not on file    Non-medical: Not on file  Tobacco Use  . Smoking status: Never Smoker  . Smokeless tobacco: Never Used  Substance and Sexual Activity  . Alcohol use: Yes    Alcohol/week: 0.0 standard drinks    Comment: rare  . Drug use: No  . Sexual activity: Yes    Birth control/protection: Pill  Lifestyle  . Physical activity:    Days per week: Not on file    Minutes per  session: Not on file  . Stress: Not on file  Relationships  . Social connections:    Talks on phone: Not on file    Gets together: Not on file    Attends religious service: Not on file    Active member of club or organization: Not on file    Attends meetings of clubs or organizations: Not on file    Relationship status: Not on file  . Intimate partner violence:    Fear of current or ex partner: Not on file    Emotionally abused: Not on file    Physically abused: Not on file    Forced sexual activity: Not on file  Other Topics Concern  . 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 Hypertension Mother   . Gout Father   . Hypertension Father   . Arthritis Father        s/p THA  . Prostate cancer Father   . Ovarian cysts Sister     BP 111/76   Pulse 86   Ht 5\' 8"  (1.727 m)   Wt 160 lb (72.6 kg)   BMI 24.33 kg/m   Review of Systems: See HPI above.     Objective:  Physical Exam:  GEN: Awake, alert, no acute distress Pulmonary: Breathing unlabored  Right shoulder: No obvious deformity or asymmetry. No bruising. No swelling Mild tenderness over the lateral shoulder as well as the trapezius Full ROM in flexion, abduction, internal/external rotation without pain NV intact distally Special Tests:  - Impingement: Neg Hawkins and Neers.  - Supraspinatous: Negative empty can.  5/5 strength - Infraspinatous/Teres: 5/5 strength with ER - Subscapularis: negative belly press, 5/5 strength with IR  Left shoulder: No obvious deformity, skin intact Full range of motion 5/5 strength with rotator cuff testing N/V intact  Assessment & Plan:  1.  Right shoulder pain- secondary to subacromial impingement  and bursitis.  She is progressing very well with physical therapy.  She has 3 weeks remaining.  Encouraged her to complete physical therapy and continue home exercise program following this.  She may follow-up as needed.  Addendum:  Patient seen and examined with fellow.  Agree with his note and findings.

## 2018-05-30 NOTE — Patient Instructions (Signed)
Continue physical therapy, transition to home exercises when comfortable with this. Do home exercises 3-4 times a week for 4-6 more weeks then you can discontinue this. Follow up with me as needed otherwise.

## 2018-06-06 ENCOUNTER — Encounter: Payer: Self-pay | Admitting: Physical Therapy

## 2018-06-06 ENCOUNTER — Ambulatory Visit: Payer: 59 | Admitting: Physical Therapy

## 2018-06-06 DIAGNOSIS — R252 Cramp and spasm: Secondary | ICD-10-CM

## 2018-06-06 DIAGNOSIS — M25511 Pain in right shoulder: Principal | ICD-10-CM

## 2018-06-06 DIAGNOSIS — G8929 Other chronic pain: Secondary | ICD-10-CM

## 2018-06-06 NOTE — Therapy (Signed)
Chelsea Stafford [address]  [place], [place], [address] Phone: [phone]   Fax:  [phone]  Physical Therapy Treatment  Patient Details  Name: Chelsea Stafford MRN: [id number] Date of Birth: 1981/06/19 Referring Provider (PT): Karlton Lemon MD   Encounter Date: 06/06/2018  PT End of Session - 06/06/18 0849    Visit Number  10    Number of Visits  12    Date for PT Re-Evaluation  06/20/18    Authorization Type  UHC    PT Start Time  [phone]    PT Stop Time  0930    PT Time Calculation (min)  41 min    Activity Tolerance  Patient tolerated treatment well    Behavior During Therapy  [facility] for tasks assessed/performed       Past Medical History:  Diagnosis Date  . History of Bell's palsy 1999    History reviewed. No pertinent surgical history.  There were no vitals filed for this visit.  Subjective Assessment - 06/06/18 0852    Subjective  Pt reporting she is looking into gym memberships with her husband. Reports no recent sharp pain in upper shoulder but notes more of a lingering dull ache.    Pertinent History  PMH: Bell's Palsy    Patient Stated Goals  keep the pain down    Currently in Pain?  Yes    Pain Score  1     Pain Location  Shoulder    Pain Orientation  Right;Upper    Pain Descriptors / Indicators  Dull;Aching    Pain Type  Chronic pain    Pain Onset  More than a month ago                       [facility] Adult PT Treatment/Exercise - 06/06/18 0849      Exercises   Exercises  Shoulder      Shoulder Exercises: ROM/Strengthening   UBE (Upper Arm Bike)  L2.0 x 6 min (3' fwd/3' back)      Shoulder Exercises: Stretch   Other Shoulder Stretches  Rt UT, LS & scalenes stretch 2 x 30 each      Manual Therapy   Manual Therapy  Soft tissue mobilization;Myofascial release    Manual therapy comments  prone    Soft tissue mobilization  STM to R UT, LS & scalenes    Myofascial Release  Manual  TPR to mid R UT       Trigger Stafford Dry Needling - 06/06/18 0849    Consent Given?  Yes    Education Handout Provided  Yes    Muscles Treated Upper Body  Upper trapezius;Levator scapulae   Rt   Upper Trapezius Response  Twitch reponse elicited;Palpable increased muscle length    Levator Scapulae Response  Twitch response elicited;Palpable increased muscle length                PT Long Term Goals - 05/30/18 0904      PT LONG TERM GOAL #1   Title  Pt will be I and compliant with HEP.     Status  Partially Met      PT LONG TERM GOAL #2   Title  Pt will improve FOTO to less than 28% limited so show improved function.     Status  Achieved      PT LONG TERM GOAL #3   Title  Pt will increase Rt  shoulder AROM to WNL.     Status  Partially Met      PT LONG TERM GOAL #4   Title  Pt will increase Rt shoulder strength to 5/5 MMT all planes.    Status  Partially Met      PT LONG TERM GOAL #5   Title  Pt will report no more than 1-2 out of 10 with usual activity and housework.     Status  Achieved            Plan - 06/06/18 0855    Clinical Impression Statement  [place] expressing desire to try previously discussed DN for R upper shoulder. Shoulder reassessed with taut bands and TPs identified in R UT & LS - upon informed pt consent performed DN in conjunction with manual therapy to R UT & LS with positive twitch response elicited and palpable reduction in muscle tension following treatment. Provided instruction in stretches for R upper shoulder (pt declining need for HEP handout) and encouraged pt to come prepared with HEP questions on next visit in prep for transition to HEP at end of current POC. Will also further address pt's questions/concerns regarding appropriate gym activities.    PT Treatment/Interventions  Cryotherapy;Electrical Stimulation;Iontophoresis 16m/ml Dexamethasone;Ultrasound;Therapeutic activities;Therapeutic exercise;Neuromuscular re-education;Manual  techniques;Passive range of motion;Dry needling;Taping;Vasopneumatic Device;Joint Manipulations    PT Home Exercise Plan  doorway st, post capsule stretch, IR/ER, wall slides, row (green TB), B extension (green TB), Prone I's, T's, Y's (pt. standing now with Y's alternating X)    Consulted and Agree with Plan of Care  Patient       Patient will benefit from skilled therapeutic intervention in order to improve the following deficits and impairments:  Decreased activity tolerance, Decreased range of motion, Decreased endurance, Decreased strength, Impaired flexibility, Pain, Increased fascial restricitons  Visit Diagnosis: Chronic right shoulder pain  Cramp and spasm     Problem List Patient Active Problem List   Diagnosis Date Noted  . Right shoulder pain 07/02/2017  . Preventative health care 08/02/2015  . Paresthesia 06/17/2015    Chelsea Stafford PT, MPT 06/06/2018, 9:49 AM  [facility] [place] [address]Phone: [phone]  Fax:  [phone] Name: Chelsea SparkMRN: [id number] of Birth: 121-Nov-1982

## 2018-06-13 ENCOUNTER — Ambulatory Visit: Payer: 59 | Admitting: Family

## 2018-06-13 ENCOUNTER — Encounter: Payer: Self-pay | Admitting: Family

## 2018-06-13 VITALS — BP 106/60 | HR 88 | Temp 98.1°F | Ht 68.0 in | Wt 169.4 lb

## 2018-06-13 DIAGNOSIS — D229 Melanocytic nevi, unspecified: Secondary | ICD-10-CM | POA: Diagnosis not present

## 2018-06-13 DIAGNOSIS — Z23 Encounter for immunization: Secondary | ICD-10-CM | POA: Diagnosis not present

## 2018-06-13 NOTE — Progress Notes (Signed)
Subjective:    Patient ID: Chelsea Stafford, female    DOB: 1980/09/26, 37 y.o.   MRN: [id number]  HPI  Pt presents today with c/o multiple nevi on her back. Notes that "I can't see them and I just want to get them checked."    Requests flu shot today.   Review of Systems See HPI  Past Medical History:  Diagnosis Date  . History of Bell's palsy 1999     Social History   Socioeconomic History  . Marital status: Married    Spouse name: Not on file  . Number of children: Not on file  . Years of education: Not on file  . Highest education level: Not on file  Occupational History  . Not on file  Social Needs  . Financial resource strain: Not on file  . Food insecurity:    Worry: Not on file    Inability: Not on file  . Transportation needs:    Medical: Not on file    Non-medical: Not on file  Tobacco Use  . Smoking status: Never Smoker  . Smokeless tobacco: Never Used  Substance and Sexual Activity  . Alcohol use: Yes    Alcohol/week: 0.0 standard drinks    Comment: rare  . Drug use: No  . Sexual activity: Yes    Birth control/protection: Pill  Lifestyle  . Physical activity:    Days per week: Not on file    Minutes per session: Not on file  . Stress: Not on file  Relationships  . Social connections:    Talks on phone: Not on file    Gets together: Not on file    Attends religious service: Not on file    Active member of club or organization: Not on file    Attends meetings of clubs or organizations: Not on file    Relationship status: Not on file  . Intimate partner violence:    Fear of current or ex partner: Not on file    Emotionally abused: Not on file    Physically abused: Not on file    Forced sexual activity: Not on file  Other Topics Concern  . Not on file  Social History Narrative   Married   3 children   2009- son, [name]   2011- [name]   2013- [name]   Homemaker   Completed bachelors at [employer] ([employer])   Enjoys  reading, computer, Photographer   Grew up in [place]    No past surgical history on file.  Family History  Problem Relation Age of Onset  . Hypertension Mother   . Gout Father   . Hypertension Father   . Arthritis Father        s/p THA  . Prostate cancer Father   . Ovarian cysts Sister     No Known Allergies  Current Outpatient Medications on File Prior to Visit  Medication Sig Dispense Refill  . levonorgestrel-ethinyl estradiol (SRONYX) 0.1-20 MG-MCG tablet Take 1 tablet by mouth daily.     No current facility-administered medications on file prior to visit.     BP 106/60 (BP Location: Right Arm, Patient Position: Sitting, Cuff Size: Normal)   Pulse 88   Temp 98.1 F (36.7 C) (Oral)   Ht 5\' 8"  (1.727 m)   Wt 169 lb 6.4 oz (76.8 kg) Comment: With boots  LMP 05/23/2018 (Exact Date)   SpO2 98%   BMI 25.76 kg/m       Objective:  Physical Exam  Constitutional: She appears well-developed and well-nourished.  Cardiovascular: Normal rate, regular rhythm and normal heart sounds.  No murmur heard. Pulmonary/Chest: Effort normal and breath sounds normal. No respiratory distress. She has no wheezes.  Psychiatric: She has a normal mood and affect. Her behavior is normal. Judgment and thought content normal.  skin: multiple large brown raised nevi noted on back        Assessment & Plan:  Multiple nevi- will refer to dermatology for further evaluation.    Flu shot today.

## 2018-06-13 NOTE — Patient Instructions (Signed)
You should be contacted about your referral to dermatology.  

## 2018-06-14 NOTE — Addendum Note (Signed)
Addended by: Debbrah Alar on: 06/14/2018 09:23 AM   Modules accepted: Level of Service

## 2018-06-15 ENCOUNTER — Ambulatory Visit: Payer: 59 | Admitting: Physical Therapy

## 2018-06-15 DIAGNOSIS — M25511 Pain in right shoulder: Principal | ICD-10-CM

## 2018-06-15 DIAGNOSIS — G8929 Other chronic pain: Secondary | ICD-10-CM

## 2018-06-15 DIAGNOSIS — R252 Cramp and spasm: Secondary | ICD-10-CM

## 2018-06-15 NOTE — Patient Instructions (Signed)
Access Code: [email or web address]  URL: [email or web address]  Date: 06/15/2018  Prepared by: Elsie Ra   Exercises to do using weight machines at gym Seated High Lat Pull Down  - 10-20 reps - 1-2 sets - 1x daily - 6x weekly  Chest Press  - 10-20 reps - 1-2 sets - 1x daily - 6x weekly  Seated Row  - 10-20 reps - 2-3 sets - 1x daily - 6x weekly  Seated military press - 10-20 reps - 1-2 sets - 1x daily - 6x weekly

## 2018-06-15 NOTE — Therapy (Addendum)
[facility] [address]  [place], [place], [address] Phone: [phone]   Fax:  [phone]  Physical Therapy Treatment/Discharge Addendum  Patient Details  Name: Chelsea Stafford MRN: [id number] Date of Birth: [birth year] Referring Provider (PT): Karlton Lemon MD   Encounter Date: 06/15/2018  PT End of Session - 06/15/18 1539    Visit Number  11    Number of Visits  12    Date for PT Re-Evaluation  06/20/18    Authorization Type  [employer]    PT Start Time  2947    PT Stop Time  1530    PT Time Calculation (min)  45 min    Activity Tolerance  Patient tolerated treatment well    Behavior During Therapy  [facility] for tasks assessed/performed       Past Medical History:  Diagnosis Date  . History of Bell's palsy 1999    No past surgical history on file.  There were no vitals filed for this visit.  Subjective Assessment - 06/15/18 1537    Subjective  Pt relays she wishes for this this to be her last visit and she plans to transition to gym and wants to keep her last remaining visit open in case she needs to return for one final tune up. She was told this needs to be within 30 days or she will need new MD order    Currently in Pain?  No/denies         [facility] PT Assessment - 06/15/18 0001      Assessment   Medical Diagnosis  Rt shoulder pain, impingement, bursitis    Referring Provider (PT)  Karlton Lemon MD      AROM   Right Shoulder Flexion  --   [facility]   Right Shoulder ABduction  --   [facility]   Right Shoulder Internal Rotation  --   WNL   Right Shoulder External Rotation  --   WNL                  [facility] Adult PT Treatment/Exercise - 06/15/18 0001      Exercises   Exercises  Shoulder      Shoulder Exercises: Standing   Other Standing Exercises  standing Overhead ball rolls red weighted ball X 20 cw,ccw,up-down, lateral      Shoulder Exercises: ROM/Strengthening   UBE (Upper Arm Bike)  L2.0 x 6 min  (3' fwd/3' back)    Lat Pull  15 reps    Lat Pull Limitations  20 lbs    Cybex Press  20 reps    Cybex Press Limitations  15 reps    Cybex Row  15 reps    Cybex Row Limitations  15 lbs, one set wide grip, one set low grip    Other ROM/Strengthening Exercises  shoulder press one set of 10 with 10 lbs narrow grip then dropped to 5 lbs for another set of 10 wide grip      Shoulder Exercises: Stretch   Cross Chest Stretch  2 reps;30 seconds    Internal Rotation Stretch  10 seconds    Internal Rotation Stretch Limitations  10 reps with strap behind back    Other Shoulder Stretches  Rt UT stretch 30 sec X 3             PT Education - 06/15/18 1538    Education Details  HEP for gym    Person(s) Educated  Patient  Methods  Explanation;Demonstration;Verbal cues;Handout    Comprehension  Verbalized understanding;Returned demonstration          PT Long Term Goals - 06/15/18 1546      PT LONG TERM GOAL #1   Title  Pt will be I and compliant with HEP.     Status  Achieved      PT LONG TERM GOAL #2   Title  Pt will improve FOTO to less than 28% limited so show improved function.     Status  Achieved      PT LONG TERM GOAL #3   Title  Pt will increase Rt shoulder AROM to WNL.     Status  Achieved      PT LONG TERM GOAL #4   Title  Pt will increase Rt shoulder strength to 5/5 MMT all planes.    Status  Partially Met      PT LONG TERM GOAL #5   Title  Pt will report no more than 1-2 out of 10 with usual activity and housework.     Status  Achieved            Plan - 06/15/18 1542    Clinical Impression Statement  Pt has made excellent progress with PT in all areas. She has now met all of her PT goals and will transition over to maintenance program at home and at gym. She was given edu today for what machines to use at gym and she was taken through these today in clinic with good form and good understanding. She wishes to keep her plan open 30 days in the event she  feels she needs to return. Her episode will be left over for 30 days and then will D/C by 12/27 if we have not heard from her.    Rehab Potential  Good    Consulted and Agree with Plan of Care  Patient       Patient will benefit from skilled therapeutic intervention in order to improve the following deficits and impairments:  Decreased activity tolerance, Decreased range of motion, Decreased endurance, Decreased strength, Impaired flexibility, Pain, Increased fascial restricitons  Visit Diagnosis: Chronic right shoulder pain  Cramp and spasm     Problem List Patient Active Problem List   Diagnosis Date Noted  . Right shoulder pain 07/02/2017  . Preventative health care 08/02/2015  . Paresthesia 06/17/2015    Debbe Odea, PT,DPT 06/15/2018, 3:48 PM  PHYSICAL THERAPY DISCHARGE SUMMARY  Visits from Start of Care: 11  Current functional level related to goals / functional outcomes: See above  Remaining deficits: See above   Education / Equipment: HEP Plan: Patient agrees to discharge.  Patient goals were met. Patient is being discharged due to being pleased with the current functional level.  ?????    Episode was held open for 30 days in the event she wanted to return, she has not and will be discharged.  Elsie Ra, PT, DPT 07/22/18 10:01 AM   [facility] [address]  [place], [place], [address] Phone: [phone]   Fax:  [phone]  Name: Jamyra Zweig MRN: [id number] Date of Birth: 1981-06-27

## 2018-07-25 DIAGNOSIS — D225 Melanocytic nevi of trunk: Secondary | ICD-10-CM | POA: Diagnosis not present

## 2018-07-25 DIAGNOSIS — D2262 Melanocytic nevi of left upper limb, including shoulder: Secondary | ICD-10-CM | POA: Diagnosis not present

## 2018-07-30 DIAGNOSIS — H6123 Impacted cerumen, bilateral: Secondary | ICD-10-CM | POA: Diagnosis not present

## 2018-07-30 DIAGNOSIS — H60391 Other infective otitis externa, right ear: Secondary | ICD-10-CM | POA: Diagnosis not present

## 2018-09-20 DIAGNOSIS — Z3041 Encounter for surveillance of contraceptive pills: Secondary | ICD-10-CM | POA: Diagnosis not present

## 2018-09-20 DIAGNOSIS — Z01419 Encounter for gynecological examination (general) (routine) without abnormal findings: Secondary | ICD-10-CM | POA: Diagnosis not present

## 2020-02-09 ENCOUNTER — Other Ambulatory Visit: Payer: Self-pay

## 2020-02-09 ENCOUNTER — Encounter: Payer: Self-pay | Admitting: Family

## 2020-02-09 ENCOUNTER — Ambulatory Visit: Payer: 59 | Admitting: Family

## 2020-02-09 VITALS — BP 101/80 | HR 78 | Temp 98.5°F | Resp 16 | Ht 67.0 in | Wt 161.0 lb

## 2020-02-09 DIAGNOSIS — G8929 Other chronic pain: Secondary | ICD-10-CM | POA: Diagnosis not present

## 2020-02-09 DIAGNOSIS — M25511 Pain in right shoulder: Secondary | ICD-10-CM

## 2020-02-09 MED ORDER — MELOXICAM 7.5 MG PO TABS: 7.5000 mg | ORAL_TABLET | Freq: Every day | ORAL | 0 refills | Status: AC

## 2020-02-09 NOTE — Progress Notes (Signed)
Subjective:    Patient ID: Chelsea Stafford, female    DOB: 02-26-1981, 39 y.o.   MRN: [id number]  HPI  Patient is a 39 yr old female who presents today with chief complaint of right shoulder pain.  Reports that pain has been present for 2 years and has been worsening.  Saw sports med, had cortisone, PT, Dry needling.  Second injection did not help at all.     Review of Systems    see HPI  Past Medical History:  Diagnosis Date  . History of Bell's palsy 1999     Social History   Socioeconomic History  . Marital status: Married    Spouse name: Not on file  . Number of children: Not on file  . Years of education: Not on file  . Highest education level: Not on file  Occupational History  . Not on file  Tobacco Use  . Smoking status: Never Smoker  . Smokeless tobacco: Never Used  Substance and Sexual Activity  . Alcohol use: Yes    Alcohol/week: 0.0 standard drinks    Comment: rare  . Drug use: No  . Sexual activity: Yes    Birth control/protection: Pill  Other Topics Concern  . Not on file  Social History Narrative   Married   3 children   2009- son, [name]   2011- [name]   2013- [name]   Agricultural engineer   Completed bachelors at [employer]   Enjoys reading, Teaching laboratory technician, Photographer   Grew up in [place] Strain:   . Difficulty of Paying Living Expenses:   Food Insecurity:   . Worried About Charity fundraiser in the Last Year:   . Arboriculturist in the Last Year:   Transportation Needs:   . Film/video editor (Medical):   Marland Kitchen Lack of Transportation (Non-Medical):   Physical Activity:   . Days of Exercise per Week:   . Minutes of Exercise per Session:   Stress:   . Feeling of Stress :   Social Connections:   . Frequency of Communication with Friends and Family:   . Frequency of Social Gatherings with Friends and Family:   . Attends Religious Services:   . Active Member  of Clubs or Organizations:   . Attends Archivist Meetings:   Marland Kitchen Marital Status:   Intimate Partner Violence:   . Fear of Current or Ex-Partner:   . Emotionally Abused:   Marland Kitchen Physically Abused:   . Sexually Abused:     No past surgical history on file.  Family History  Problem Relation Age of Onset  . Hypertension Mother   . Gout Father   . Hypertension Father   . Arthritis Father        s/p THA  . Prostate cancer Father   . Ovarian cysts Sister     No Known Allergies  Current Outpatient Medications on File Prior to Visit  Medication Sig Dispense Refill  . levonorgestrel-ethinyl estradiol (SRONYX) 0.1-20 MG-MCG tablet Take 1 tablet by mouth daily.     No current facility-administered medications on file prior to visit.    BP 101/80 (BP Location: Right Arm, Patient Position: Sitting, Cuff Size: Small)   Pulse 78   Temp 98.5 F (36.9 C) (Oral)   Resp 16   Ht 5\' 7"  (1.702 m)   Wt 161 lb (73 kg)   SpO2 98%   BMI  25.22 kg/m    Objective:   Physical Exam Constitutional:      Appearance: Normal appearance.  HENT:     Head: Normocephalic and atraumatic.  Musculoskeletal:     Comments: Right shoulder is without swelling. + pain with abduction of right shoulder, + crepitus   Neurological:     Mental Status: She is alert.           Assessment & Plan:  Right shoulder pain- worsening.  Will give trial of meloxicam and refer to orthopedics for further evaluation.  At this point she would likely benefit from MRI- but I will defer to orthopedics.  This visit occurred during the SARS-CoV-2 public health emergency.  Safety protocols were in place, including screening questions prior to the visit, additional usage of staff PPE, and extensive cleaning of exam room while observing appropriate contact time as indicated for disinfecting solutions.

## 2020-02-09 NOTE — Patient Instructions (Signed)
You should be contacted about scheduling your appointment with orthopedics. You may use meloxicam once daily as needed for pain.

## 2020-02-20 ENCOUNTER — Ambulatory Visit: Payer: 59 | Admitting: Orthopaedic Surgery

## 2020-02-20 ENCOUNTER — Other Ambulatory Visit: Payer: Self-pay

## 2020-02-20 ENCOUNTER — Ambulatory Visit: Payer: Self-pay

## 2020-02-20 ENCOUNTER — Encounter: Payer: Self-pay | Admitting: Orthopaedic Surgery

## 2020-02-20 VITALS — Ht 68.0 in | Wt 161.8 lb

## 2020-02-20 DIAGNOSIS — G8929 Other chronic pain: Secondary | ICD-10-CM | POA: Diagnosis not present

## 2020-02-20 DIAGNOSIS — M25511 Pain in right shoulder: Secondary | ICD-10-CM | POA: Diagnosis not present

## 2020-02-20 NOTE — Progress Notes (Signed)
Office Visit Note   Patient: Chelsea Stafford           Date of Birth: [birth year]           MRN: [id number] Visit Date: 02/20/2020              Requested by: Debbrah Alar, NP [address] [place],  [place] [address] PCP: Debbrah Alar, NP   Assessment & Plan: Visit Diagnoses:  1. Chronic right shoulder pain     Plan: Impression is chronic right shoulder pain.  Due to the patient's chronicity of symptoms and lack of improvement with physical therapy or cortisone injection, feel it is appropriate to proceed with an MRI to further assess for structural abnormalities.  She will follow up with [place] once has been completed.  Follow-Up Instructions: Return for after MRI.   Orders:  Orders Placed This Encounter  Procedures  . XR Shoulder Right   No orders of the defined types were placed in this encounter.     Procedures: No procedures performed   Clinical Data: No additional findings.   Subjective: Chief Complaint  Patient presents with  . Right Shoulder - Pain    HPI patient is a pleasant 39 year old female who presents to our clinic today with right shoulder pain.  This has been bothering her for the past 2 to 3 years and has slightly worsened.  There is never been a specific injury, but she does note that she was doing karate prior to the onset of pain.  The pain she does have is to the top of her shoulder and is described as a low-grade constant ache.  She does have increased pain with abduction, forward flexion and internal rotation.  She has tried Aleve and Tylenol with mild relief of symptoms.  She has had 2 cortisone injections with the first one helping for approximately 1 month and the second 1 not helping at all.  She has also completed several months of physical therapy without relief.  No previous MRI of the shoulder.  Review of Systems as detailed in HPI.  All others reviewed and are negative.   Objective: Vital Signs: Ht 5\' 8"  (1.727 m)    Wt 161 lb 12.8 oz (73.4 kg)   BMI 24.60 kg/m   Physical Exam well-developed and well-nourished female in no acute distress.  Alert oriented x3.  Ortho Exam examination of the right shoulder reveals near full active range of motion in all planes.  She does have mild pain with the extremes of forward flexion and internal rotation.  She can internally rotate to L5.  Negative empty can and negative cross body test.  Negative O'Brien's and negative speeds.  Full strength throughout.  No tenderness to the [facility] joint.  She is neurovascular intact distally.  Specialty Comments:  No specialty comments available.  Imaging: XR Shoulder Right  Result Date: 02/20/2020 No acute or structural abnormalities    PMFS History: Patient Active Problem List   Diagnosis Date Noted  . Right shoulder pain 07/02/2017  . Preventative health care 08/02/2015  . Paresthesia 06/17/2015   Past Medical History:  Diagnosis Date  . History of Bell's palsy 1999    Family History  Problem Relation Age of Onset  . Hypertension Mother   . Gout Father   . Hypertension Father   . Arthritis Father        s/p THA  . Prostate cancer Father   . Ovarian cysts Sister  History reviewed. No pertinent surgical history. Social History   Occupational History  . Not on file  Tobacco Use  . Smoking status: Never Smoker  . Smokeless tobacco: Never Used  Substance and Sexual Activity  . Alcohol use: Yes    Alcohol/week: 0.0 standard drinks    Comment: rare  . Drug use: No  . Sexual activity: Yes    Birth control/protection: Pill

## 2020-03-16 ENCOUNTER — Ambulatory Visit
Admission: RE | Admit: 2020-03-16 | Discharge: 2020-03-16 | Disposition: A | Discharge status: Home / Self Care | Payer: 59 | Source: Ambulatory Visit | Attending: Orthopaedic Surgery | Admitting: Orthopaedic Surgery

## 2020-03-16 ENCOUNTER — Other Ambulatory Visit: Payer: Self-pay

## 2020-03-16 DIAGNOSIS — G8929 Other chronic pain: Secondary | ICD-10-CM

## 2020-03-16 DIAGNOSIS — M25511 Pain in right shoulder: Secondary | ICD-10-CM

## 2020-03-19 ENCOUNTER — Encounter: Payer: Self-pay | Admitting: Orthopaedic Surgery

## 2020-03-19 ENCOUNTER — Ambulatory Visit: Payer: 59 | Admitting: Orthopaedic Surgery

## 2020-03-19 VITALS — Ht 68.0 in | Wt 160.0 lb

## 2020-03-19 DIAGNOSIS — G8929 Other chronic pain: Secondary | ICD-10-CM | POA: Diagnosis not present

## 2020-03-19 DIAGNOSIS — M25511 Pain in right shoulder: Secondary | ICD-10-CM

## 2020-03-19 MED ORDER — LIDOCAINE HCL 1 % IJ SOLN
3.0000 mL | INTRAMUSCULAR | Status: AC | PRN
  Administered 2020-03-19: 3 mL

## 2020-03-19 MED ORDER — METAXALONE 800 MG PO TABS: 800.0000 mg | ORAL_TABLET | Freq: Three times a day (TID) | ORAL | 0 refills | Status: AC

## 2020-03-19 MED ORDER — METHYLPREDNISOLONE ACETATE 40 MG/ML IJ SUSP
40.0000 mg | INTRAMUSCULAR | Status: AC | PRN
  Administered 2020-03-19: 40 mg via INTRA_ARTICULAR

## 2020-03-19 MED ORDER — BUPIVACAINE HCL 0.5 % IJ SOLN
3.0000 mL | INTRAMUSCULAR | Status: AC | PRN
  Administered 2020-03-19: 3 mL via INTRA_ARTICULAR

## 2020-03-19 NOTE — Progress Notes (Signed)
Office Visit Note   Patient: Chelsea Stafford           Date of Birth: 12-14-1980           MRN: [id number] Visit Date: 03/19/2020              Requested by: Debbrah Alar, NP [address] [place],  [place] [address] PCP: Debbrah Alar, NP   Assessment & Plan: Visit Diagnoses:  1. Chronic right shoulder pain     Plan: Overall the pain pattern is very inconsistent with MRI findings which showed mild tendinopathy of the rotator cuff and some trace bursitis.  She did get some relief from a prior subacromial injection done in 2019.  She did get some relief from outpatient PT.  Overall I am somewhat perplexed as to what is causing her continued pain.  I have suggested going back to outpatient PT and trying dry needling or acupuncture as well.  Consider chiropractor.  I sent a prescription for Skelaxin.  She did agree to a subacromial injection today.  She tolerated this well.  We will see her back as needed.  Follow-Up Instructions: Return if symptoms worsen or fail to improve.   Orders:  Orders Placed This Encounter  Procedures   Ambulatory referral to Physical Therapy   Meds ordered this encounter  Medications   metaxalone (SKELAXIN) 800 MG tablet    Sig: Take 1 tablet (800 mg total) by mouth 3 (three) times daily.    Dispense:  20 tablet    Refill:  0      Procedures: Large Joint Inj: R subacromial bursa on 03/19/2020 11:24 AM Indications: pain Details: 22 G needle  Arthrogram: No  Medications: 3 mL lidocaine 1 %; 3 mL bupivacaine 0.5 %; 40 mg methylPREDNISolone acetate 40 MG/ML Outcome: tolerated well, no immediate complications Consent was given by the patient. Patient was prepped and draped in the usual sterile fashion.       Clinical Data: No additional findings.   Subjective: Chief Complaint  Patient presents with   Right Shoulder - Follow-up    MRI review    [name] returns today for MRI review of the right shoulder.  Reports  no changes.   Review of Systems  Constitutional: Negative.   HENT: Negative.   Eyes: Negative.   Respiratory: Negative.   Cardiovascular: Negative.   Endocrine: Negative.   Musculoskeletal: Negative.   Neurological: Negative.   Hematological: Negative.   Psychiatric/Behavioral: Negative.   All other systems reviewed and are negative.    Objective: Vital Signs: Ht 5\' 8"  (1.727 m)    Wt 160 lb (72.6 kg)    BMI 24.33 kg/m   Physical Exam Vitals and nursing note reviewed.  Constitutional:      Appearance: She is well-developed.  Pulmonary:     Effort: Pulmonary effort is normal.  Skin:    General: Skin is warm.     Capillary Refill: Capillary refill takes less than 2 seconds.  Neurological:     Mental Status: She is alert and oriented to person, place, and time.  Psychiatric:        Behavior: Behavior normal.        Thought Content: Thought content normal.        Judgment: Judgment normal.     Ortho Exam Right shoulder shows good strength to manual muscle testing.  No significant impingement signs.  She endorses pain in the supraclavicular and trapezial region. Specialty Comments:  No specialty comments available.  Imaging: No results found.   PMFS History: Patient Active Problem List   Diagnosis Date Noted   Right shoulder pain 07/02/2017   Preventative health care 08/02/2015   Paresthesia 06/17/2015   Past Medical History:  Diagnosis Date   History of Bell's palsy 1999    Family History  Problem Relation Age of Onset   Hypertension Mother    Gout Father    Hypertension Father    Arthritis Father        s/p THA   Prostate cancer Father    Ovarian cysts Sister     History reviewed. No pertinent surgical history. Social History   Occupational History   Not on file  Tobacco Use   Smoking status: Never Smoker   Smokeless tobacco: Never Used  Substance and Sexual Activity   Alcohol use: Yes    Alcohol/week: 0.0 standard drinks     Comment: rare   Drug use: No   Sexual activity: Yes    Birth control/protection: Pill

## 2020-04-01 ENCOUNTER — Ambulatory Visit: Payer: 59 | Attending: Orthopaedic Surgery | Admitting: Physical Therapy

## 2020-04-01 ENCOUNTER — Other Ambulatory Visit: Payer: Self-pay

## 2020-04-01 ENCOUNTER — Encounter: Payer: Self-pay | Admitting: Physical Therapy

## 2020-04-01 DIAGNOSIS — M62838 Other muscle spasm: Secondary | ICD-10-CM | POA: Diagnosis present

## 2020-04-01 DIAGNOSIS — M6281 Muscle weakness (generalized): Secondary | ICD-10-CM | POA: Insufficient documentation

## 2020-04-01 DIAGNOSIS — R293 Abnormal posture: Secondary | ICD-10-CM | POA: Diagnosis present

## 2020-04-01 DIAGNOSIS — G8929 Other chronic pain: Secondary | ICD-10-CM | POA: Insufficient documentation

## 2020-04-01 DIAGNOSIS — M25511 Pain in right shoulder: Secondary | ICD-10-CM | POA: Diagnosis present

## 2020-04-01 DIAGNOSIS — R252 Cramp and spasm: Secondary | ICD-10-CM | POA: Diagnosis present

## 2020-04-01 DIAGNOSIS — R29898 Other symptoms and signs involving the musculoskeletal system: Secondary | ICD-10-CM

## 2020-04-01 NOTE — Therapy (Signed)
[facility] [place] [address]  [place], [place], [address] Phone: [phone]   Fax:  [phone]  Physical Therapy Evaluation  Patient Details  Name: Chelsea Stafford MRN: [id number] Date of Birth: Jun 09, 1981 Referring Provider (PT): Azucena Cecil, MD   Encounter Date: 04/01/2020   PT End of Session - 04/01/20 1012    Visit Number 1    Number of Visits 12    Date for PT Re-Evaluation 05/13/20    Authorization Type UHC    PT Start Time 1012    PT Stop Time 1105    PT Time Calculation (min) 53 min    Activity Tolerance Patient tolerated treatment well    Behavior During Therapy [facility] for tasks assessed/performed           Past Medical History:  Diagnosis Date   History of Bell's palsy 1999    History reviewed. No pertinent surgical history.  There were no vitals filed for this visit.    Subjective Assessment - 04/01/20 1014    Subjective Pt reports chronic R upper shoulder pain for 3-4 yrs which she is unsure whether the pain is coming from her shoulder or her neck. X-rays and MRI of shoulder did not reveal any abnormalities. Had another coritsone shot in her shoulder ~2 weeks ago with no relief    Diagnostic tests R shoulder x-ray 02/20/20: No acute or structural abnormalities.  R shoulder MRI 03/16/20: Very mild appearing supraspinatus and infraspinatus tendinopathy without tear. Trace amount of subacromial/subdeltoid fluid may be due to bursitis.    Patient Stated Goals "reduce the pain and increase my mobility"    Currently in Pain? Yes    Pain Score 3    2-3/10, up to 5/10 at worst   Pain Location Shoulder    Pain Orientation Right;Upper    Pain Descriptors / Indicators Aching;Dull   intermittent sharp pain   Pain Type Chronic pain    Pain Radiating Towards up lateral neck on occasion    Pain Onset Other (comment)   3-4 yrs   Pain Frequency Constant    Aggravating Factors  moving R shoulder, overuse    Pain  Relieving Factors nothing except keeping arm still    Effect of Pain on Daily Activities tries to keep elbow close to body to avoid reaching out with R arm, unable to run for exercise due to arm swing causes pain              [facility] PT Assessment - 04/01/20 1012      Assessment   Medical Diagnosis Chronic R shoulder pain    Referring Provider (PT) N. Eduard Roux, MD    Onset Date/Surgical Date --   3-4 yrs   Hand Dominance Right    Next MD Visit none scheduled    Prior Therapy PT in 2019 for same problem      Precautions   Precautions None      Restrictions   Weight Bearing Restrictions No      Balance Screen   Has the patient fallen in the past 6 months No    Has the patient had a decrease in activity level because of a fear of falling?  No    Is the patient reluctant to leave their home because of a fear of falling?  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    Leisure walk dogs, play on computer, draw/paint, oragnize house      Cognition   Overall Cognitive Status Within Functional Limits for tasks assessed      Observation/Other Assessments   Focus on Therapeutic Outcomes (FOTO)  Shoulder - 57% (43% limitation); Predicted 70% (30% limitation)      Sensation   Light Touch Appears Intact      Posture/Postural Control   Posture/Postural Control Postural limitations    Postural Limitations Forward head;Rounded Shoulders      ROM / Strength   AROM / PROM / Strength AROM;Strength      AROM   Overall AROM Comments pain with all motions of R shoulder    AROM Assessment Site Shoulder;Cervical    Right/Left Shoulder Right;Left    Right Shoulder Flexion 151 Degrees   pain >120, worse when lowering arm   Right Shoulder ABduction 165 Degrees   pain >105   Right Shoulder Internal Rotation --   FIR to T10   Right Shoulder  External Rotation --   FER to T3   Left Shoulder Flexion 160 Degrees    Left Shoulder ABduction 180 Degrees    Left Shoulder Internal Rotation --   FIR to T7   Left Shoulder External Rotation --   FER to T3   Cervical Flexion 45    Cervical Extension 49    Cervical - Right Side Bend 35    Cervical - Left Side Bend 39    Cervical - Right Rotation 65   tight   Cervical - Left Rotation 70      Strength   Overall Strength Comments some pain assuming test positions but no increased pain with MMT resistance    Strength Assessment Site Shoulder    Right/Left Shoulder Right;Left    Right Shoulder Flexion 4-/5    Right Shoulder Extension 4/5    Right Shoulder ABduction 4/5    Right Shoulder Internal Rotation 4+/5    Right Shoulder External Rotation 4/5    Left Shoulder Flexion 4+/5    Left Shoulder Extension 4+/5    Left Shoulder ABduction 4+/5    Left Shoulder Internal Rotation 4+/5    Left Shoulder External Rotation 4+/5                      Objective measurements completed on examination: See above findings.               PT Education - 04/01/20 1102    Education Details PT eval findings, anticipated POC & initial HEP    Person(s) Educated Patient    Methods Explanation;Demonstration;Verbal cues;Handout    Comprehension Verbalized understanding;Verbal cues required;Returned demonstration;Need further instruction            PT Short Term Goals - 04/01/20 1105      PT SHORT TERM GOAL #1   Title Patient will be independent with initial HEP    Status New    Target Date 04/15/20             PT Long Term Goals - 04/01/20 1105      PT LONG TERM GOAL #1   Title Patient will be independent with ongoing/advanced HEP    Status New    Target Date 05/13/20      PT LONG TERM GOAL #2   Title Patient to improve tissue  quality as noted by reduced tissue tightness and tenderness to palpation    Status New    Target Date 05/13/20      PT LONG TERM GOAL  #3   Title Patient to improve R shoulder AROM to WNL without pain provocation    Status New    Target Date 05/13/20      PT LONG TERM GOAL #4   Title Patient will demonstrate improved R shoulder strength to >/= 4+ to 5/5 for functional UE use    Status New    Target Date 05/13/20      PT LONG TERM GOAL #5   Title Patient to report ability to perform ADLs and household chores/tasks without increased pain >1-2/10    Status New    Target Date 05/13/20      PT LONG TERM GOAL #6   Title Patient will be able to resume running for exercise w/o increased R shoulder pain    Status New    Target Date 05/13/20                  Plan - 04/01/20 1105    Clinical Impression [place] is a 39 y/o female who presents to OP PT for chronic R upper shoulder pain for ~3-4 yrs. She was previously treated in PT the same problem in late 2019 with pain attributed to impingement and bursitis with good resolution of ROM and strength at the time but feels that pain never fully resolved. Current deficits include pain with shoulder shrug or reaching up/away from body with R arm, decreased R shoulder AROM with painful arc (worse when lowering arm), abnormal muscle tension in R upper shoulder and periscapular muscles, decreased R UE strength and limited functional use of R UE with most daily tasks. [place] will benefit from skilled PT to address above deficits and restore functional ROM and strength to allow her to resume normal daily activities without pain interference.    Personal Factors and Comorbidities Time since onset of injury/illness/exacerbation;Past/Current Experience    Examination-Activity Limitations Caring for Others;Lift;Carry;Reach Overhead;Locomotion Level    Examination-Participation Restrictions Cleaning;Community Activity;Driving;Laundry;Meal Prep;Other   Running for workout   Stability/Clinical Decision Making Stable/Uncomplicated    Clinical Decision Making Low    Rehab  Potential Good    PT Frequency 2x / week    PT Duration 6 weeks    PT Treatment/Interventions ADLs/Self Care Home Management;Cryotherapy;Electrical Stimulation;Iontophoresis 4mg /ml Dexamethasone;Moist Heat;Traction;Ultrasound;Functional mobility training;Therapeutic activities;Therapeutic exercise;Neuromuscular re-education;Patient/family education;Manual techniques;Passive range of motion;Dry needling;Taping;Vasopneumatic Device;Spinal Manipulations;Joint Manipulations    PT Next Visit Plan Review initial HEP; manual therapy with potential DN to address increased muscle tension; neck/shoulder stretching/flexibility; postural strengthening; modalities PRN    Consulted and Agree with Plan of Care Patient           Patient will benefit from skilled therapeutic intervention in order to improve the following deficits and impairments:  Decreased activity tolerance, Decreased endurance, Decreased range of motion, Decreased strength, Increased fascial restricitons, Increased muscle spasms, Impaired perceived functional ability, Impaired flexibility, Impaired UE functional use, Improper body mechanics, Postural dysfunction, Pain  Visit Diagnosis: Chronic right shoulder pain  Other muscle spasm  Other symptoms and signs involving the musculoskeletal system  Muscle weakness (generalized)  Abnormal posture     Problem List Patient Active Problem List   Diagnosis Date Noted   Right shoulder pain 07/02/2017   Preventative health care 08/02/2015   Paresthesia 06/17/2015    Percival Spanish, PT, MPT 04/01/2020, 12:46 PM  [facility] [address]  [place], [place], [address] Phone: [phone]   Fax:  [phone]  Name: Chelsea Stafford MRN: [id number] Date of Birth: Sep 13, 1980

## 2020-04-01 NOTE — Patient Instructions (Addendum)
° ° °  Home exercise program created by Annie Paras, PT.  For questions, please contact Aerionna Moravek via phone at [phone] or email at [email or web address]  [facility] [place] [place], [place], [address] Phone: [phone]   Fax:  [phone]     Trigger Point Dry Needling   What is Trigger Point Dry Needling (DN)? o DN is a physical therapy technique used to treat muscle pain and dysfunction. Specifically, DN helps deactivate muscle trigger points (muscle knots).  o A thin filiform needle is used to penetrate the skin and stimulate the underlying trigger point. The goal is for a local twitch response (LTR) to occur and for the trigger point to relax. No medication of any kind is injected during the procedure.    What Does Trigger Point Dry Needling Feel Like?  o The procedure feels different for each individual patient. Some patients report that they do not actually feel the needle enter the skin and overall the process is not painful. Very mild bleeding may occur. However, many patients feel a deep cramping in the muscle in which the needle was inserted. This is the local twitch response.    How Will I feel after the treatment? o Soreness is normal, and the onset of soreness may not occur for a few hours. Typically this soreness does not last longer than two days.  o Bruising is uncommon, however; ice can be used to decrease any possible bruising.  o In rare cases feeling tired or nauseous after the treatment is normal. In addition, your symptoms may get worse before they get better, this period will typically not last longer than 24 hours.    What Can I do After My Treatment? o Increase your hydration by drinking more water for the next 24 hours. o You may place ice or heat on the areas treated that have become sore, however, do not use heat on inflamed or bruised areas. Heat often brings more relief post  needling. o You can continue your regular activities, but vigorous activity is not recommended initially after the treatment for 24 hours. o DN is best combined with other physical therapy such as strengthening, stretching, and other therapies.

## 2020-04-03 ENCOUNTER — Other Ambulatory Visit: Payer: Self-pay

## 2020-04-03 ENCOUNTER — Ambulatory Visit: Payer: 59 | Admitting: Physical Therapy

## 2020-04-03 ENCOUNTER — Encounter: Payer: Self-pay | Admitting: Physical Therapy

## 2020-04-03 DIAGNOSIS — R29898 Other symptoms and signs involving the musculoskeletal system: Secondary | ICD-10-CM

## 2020-04-03 DIAGNOSIS — M62838 Other muscle spasm: Secondary | ICD-10-CM

## 2020-04-03 DIAGNOSIS — R293 Abnormal posture: Secondary | ICD-10-CM

## 2020-04-03 DIAGNOSIS — G8929 Other chronic pain: Secondary | ICD-10-CM

## 2020-04-03 DIAGNOSIS — M25511 Pain in right shoulder: Secondary | ICD-10-CM

## 2020-04-03 DIAGNOSIS — M6281 Muscle weakness (generalized): Secondary | ICD-10-CM

## 2020-04-03 NOTE — Therapy (Signed)
[facility] [place] [facility] [address]  [place], [place], [address] Phone: [phone]   Fax:  [phone]  Physical Therapy Treatment  Patient Details  Name: Chelsea Stafford MRN: [id number] Date of Birth: 07-Jul-1981 Referring Provider (PT): Azucena Cecil, MD   Encounter Date: 04/03/2020   PT End of Session - 04/03/20 1014    Visit Number 2    Number of Visits 12    Date for PT Re-Evaluation 05/13/20    Authorization Type UHC    PT Start Time 6283    PT Stop Time 1116    PT Time Calculation (min) 62 min    Activity Tolerance Patient tolerated treatment well    Behavior During Therapy [facility] for tasks assessed/performed           Past Medical History:  Diagnosis Date  . History of Bell's palsy 1999    History reviewed. No pertinent surgical history.  There were no vitals filed for this visit.   Subjective Assessment - 04/03/20 1018    Subjective Pt reports she anticipates her pain being worse this week as she has a lot of physical things to do.    Diagnostic tests R shoulder x-ray 02/20/20: No acute or structural abnormalities.  R shoulder MRI 03/16/20: Very mild appearing supraspinatus and infraspinatus tendinopathy without tear. Trace amount of subacromial/subdeltoid fluid may be due to bursitis.    Patient Stated Goals "reduce the pain and increase my mobility"    Currently in Pain? Yes    Pain Score 3    2-3/10   Pain Location Shoulder    Pain Orientation Right;Upper    Pain Descriptors / Indicators Aching;Dull    Pain Type Chronic pain    Pain Frequency Intermittent                             [facility] Adult PT Treatment/Exercise - 04/03/20 1014      Shoulder Exercises: Standing   Extension Both;10 reps;Strengthening;Theraband    Theraband Level (Shoulder Extension) Level 2 (Red)    Extension Limitations cues for scap retraction & depression with shoulder extension to neutral    Row Both;10  reps;Strengthening;Theraband    Theraband Level (Shoulder Row) Level 2 (Red)    Row Limitations cues for scap retraction & depression, avoiding excessive shoulder extension or upward rotation of elbows      Shoulder Exercises: ROM/Strengthening   UBE (Upper Arm Bike) L1.0 x 6 min (3' fwd/3'back)      Modalities   Modalities Electrical Stimulation;Moist Heat      Moist Heat Therapy   Number Minutes Moist Heat 15 Minutes    Moist Heat Location Cervical;Shoulder   Rt     Electrical Stimulation   Electrical Stimulation Location R lateral neck & upper shoulder    Electrical Stimulation Action IFC    Electrical Stimulation Parameters 80-150 Hz, intensity to pt tol x 15'    Electrical Stimulation Goals Pain;Tone      Manual Therapy   Manual Therapy Soft tissue mobilization;Myofascial release;Scapular mobilization;Passive ROM;Manual Traction;Joint mobilization    Manual therapy comments skilled palpation and monitoring during DN    Joint Mobilization Rt 1st & 2nd rib mobis    Soft tissue mobilization STM to R UT, LS, scalenes, rhomboids & subscapularis    Myofascial Release manual TPR to R UT & LS; pin & stretch to R UT    Scapular Mobilization R medial  scapular gapping    Passive ROM gentle manual stretches for R UT & LS 2 x 30 sec as well as gentle cervical PROM in all planes    Manual Traction gentle manual cervical distraction 3 x 30 sec            Trigger Point Dry Needling - 04/03/20 1014    Consent Given? Yes    Education Handout Provided Previously provided    Muscles Treated Head and Neck Upper trapezius;Levator scapulae   Rt   Muscles Treated Upper Quadrant Rhomboids;Subscapularis   Rt   Upper Trapezius Response Twitch reponse elicited;Palpable increased muscle length    Levator Scapulae Response Twitch response elicited;Palpable increased muscle length    Rhomboids Response Twitch response elicited;Palpable increased muscle length    Subscapularis Response Twitch response  elicited;Palpable increased muscle length                  PT Short Term Goals - 04/03/20 1020      PT SHORT TERM GOAL #1   Title Patient will be independent with initial HEP    Status On-going    Target Date 04/15/20             PT Long Term Goals - 04/03/20 1020      PT LONG TERM GOAL #1   Title Patient will be independent with ongoing/advanced HEP    Status On-going    Target Date 05/13/20      PT LONG TERM GOAL #2   Title Patient to improve tissue quality as noted by reduced tissue tightness and tenderness to palpation    Status On-going    Target Date 05/13/20      PT LONG TERM GOAL #3   Title Patient to improve R shoulder AROM to WNL without pain provocation    Status On-going    Target Date 05/13/20      PT LONG TERM GOAL #4   Title Patient will demonstrate improved R shoulder strength to >/= 4+ to 5/5 for functional UE use    Status On-going    Target Date 05/13/20      PT LONG TERM GOAL #5   Title Patient to report ability to perform ADLs and household chores/tasks without increased pain >1-2/10    Status On-going    Target Date 05/13/20      PT LONG TERM GOAL #6   Title Patient will be able to resume running for exercise w/o increased R shoulder pain    Status On-going                 Plan - 04/03/20 [place] denies any issues or need for review today with initial HEP. She states she anticipates increased pain this week due to a busy week with a lot of physical demands. With this in mind, today's session initiated with manual STM & MFR incorporating DN upon informed patient consent to address increased muscle tension in hopes of reducing pain - good twitch responses elicited with DN with palpable reduction in muscle tension noted following MT. Introduced resisted scapular/postural strengthening to reinforce postural stability but discontinued and not added to HEP due to c/o increasing pain in R lateral  neck during exercise performance. Session concluded with estim and moist heat to promote pain reduction and further muscle relaxation.    Personal Factors and Comorbidities Time since onset of injury/illness/exacerbation;Past/Current Experience    Examination-Activity Limitations Caring for Others;Lift;Carry;Reach Overhead;Locomotion Level  Examination-Participation Restrictions Cleaning;Community Activity;Driving;Laundry;Meal Prep;Other   Running for workout   Rehab Potential Good    PT Frequency 2x / week    PT Duration 6 weeks    PT Treatment/Interventions ADLs/Self Care Home Management;Cryotherapy;Electrical Stimulation;Iontophoresis 4mg /ml Dexamethasone;Moist Heat;Traction;Ultrasound;Functional mobility training;Therapeutic activities;Therapeutic exercise;Neuromuscular re-education;Patient/family education;Manual techniques;Passive range of motion;Dry needling;Taping;Vasopneumatic Device;Spinal Manipulations;Joint Manipulations    PT Next Visit Plan assess response to DN; manual therapy with DN as benefit noted to address increased muscle tension; neck/shoulder stretching/flexibility; postural strengthening; modalities PRN    Consulted and Agree with Plan of Care Patient           Patient will benefit from skilled therapeutic intervention in order to improve the following deficits and impairments:  Decreased activity tolerance, Decreased endurance, Decreased range of motion, Decreased strength, Increased fascial restricitons, Increased muscle spasms, Impaired perceived functional ability, Impaired flexibility, Impaired UE functional use, Improper body mechanics, Postural dysfunction, Pain  Visit Diagnosis: Chronic right shoulder pain  Other muscle spasm  Other symptoms and signs involving the musculoskeletal system  Muscle weakness (generalized)  Abnormal posture     Problem List Patient Active Problem List   Diagnosis Date Noted  . Right shoulder pain 07/02/2017  .  Preventative health care 08/02/2015  . Paresthesia 06/17/2015    Percival Spanish, PT, MPT 04/03/2020, 12:43 PM  [facility] [address]  [place], [place], [address] Phone: [phone]   Fax:  [phone]  Name: Oreta [place] MRN: [id number] Date of Birth: [birth year]

## 2020-04-10 ENCOUNTER — Other Ambulatory Visit: Payer: Self-pay

## 2020-04-10 ENCOUNTER — Ambulatory Visit: Payer: 59 | Admitting: Physical Therapy

## 2020-04-10 DIAGNOSIS — M25511 Pain in right shoulder: Secondary | ICD-10-CM | POA: Diagnosis not present

## 2020-04-10 DIAGNOSIS — G8929 Other chronic pain: Secondary | ICD-10-CM

## 2020-04-10 DIAGNOSIS — M62838 Other muscle spasm: Secondary | ICD-10-CM

## 2020-04-10 DIAGNOSIS — R293 Abnormal posture: Secondary | ICD-10-CM

## 2020-04-10 DIAGNOSIS — R29898 Other symptoms and signs involving the musculoskeletal system: Secondary | ICD-10-CM

## 2020-04-10 DIAGNOSIS — M6281 Muscle weakness (generalized): Secondary | ICD-10-CM

## 2020-04-10 NOTE — Patient Instructions (Signed)
    Home exercise program created by Nahun Kronberg, PT.  For questions, please contact Tristram Milian via phone at [phone] or email at [email or web address]  [facility] [place] [address]  [address] [place], [place], [address] Phone: [phone]   Fax:  [phone]    

## 2020-04-10 NOTE — Therapy (Signed)
[facility] [address]  [place], [place], [address] Phone: [phone]   Fax:  [phone]  Physical Therapy Treatment  Patient Details  Name: Chelsea Stafford MRN: [id number] Date of Birth: 01/20/1981 Referring Provider (PT): Azucena Cecil, MD   Encounter Date: 04/10/2020   PT End of Session - 04/10/20 0801    Visit Number 3    Number of Visits 12    Date for PT Re-Evaluation 05/13/20    Authorization Type UHC    PT Start Time 0801    PT Stop Time 0902    PT Time Calculation (min) 61 min    Activity Tolerance Patient tolerated treatment well    Behavior During Therapy [facility] for tasks assessed/performed           Past Medical History:  Diagnosis Date  . History of Bell's palsy 1999    No past surgical history on file.  There were no vitals filed for this visit.   Subjective Assessment - 04/10/20 0804    Subjective Pt reports Sat her shoulder pain was ~4/10 but by Mon the pain was starting to feel better than last week. Now notes more puling/pain under scapula as well as some discomfort in lateral neck.    Diagnostic tests R shoulder x-ray 02/20/20: No acute or structural abnormalities.  R shoulder MRI 03/16/20: Very mild appearing supraspinatus and infraspinatus tendinopathy without tear. Trace amount of subacromial/subdeltoid fluid may be due to bursitis.    Patient Stated Goals "reduce the pain and increase my mobility"    Currently in Pain? Yes    Pain Score 2     Pain Location Shoulder   at base of neck   Pain Orientation Right;Upper    Pain Descriptors / Indicators Tightness;Aching    Pain Type Chronic pain    Pain Radiating Towards lateral neck and under scapula                             [facility] Adult PT Treatment/Exercise - 04/10/20 0801      Shoulder Exercises: Standing   External Rotation Both;10 reps;Theraband;Strengthening    Theraband Level (Shoulder External Rotation) Level 2  (Red)    External Rotation Limitations + scap retraction     Extension Both;10 reps;Strengthening;Theraband    Theraband Level (Shoulder Extension) Level 2 (Red)    Extension Limitations cues for scap retraction/depression + slight ER with shoulder extension to neutral    Row Both;10 reps;Strengthening;Theraband    Theraband Level (Shoulder Row) Level 2 (Red)    Row Limitations cues for scap retraction & depression, avoiding excessive shoulder extension or upward rotation of elbows      Shoulder Exercises: ROM/Strengthening   UBE (Upper Arm Bike) L1.5 x 6 min (3' fwd/3'back)    Other ROM/Strengthening Exercises Red TB shawl crossed behind back - scap retraction & depression 10 x 5"      Electrical Stimulation   Electrical Stimulation Location R UT & periscapular muscles    Electrical Stimulation Action IFC    Electrical Stimulation Parameters 80-150 Hz, intensity to pt tol x 15'    Electrical Stimulation Goals Pain;Tone      Manual Therapy   Manual Therapy Soft tissue mobilization;Myofascial release;Scapular mobilization    Soft tissue mobilization STM to R UT, LS, scalenes, rhomboids & subscapularis    Myofascial Release manual TPR to R rhomboids and subscapularis    Scapular Mobilization  R medial scapular gapping; R scap mobs all directions      Neck Exercises: Stretches   Other Neck Stretches B and R only rhomboid stretches 2 x 30 sec                  PT Education - 04/10/20 0845    Education Details HEP update - red TB scap retraction + shoulder extension/ER    Person(s) Educated Patient    Methods Explanation;Demonstration;Verbal cues;Handout    Comprehension Verbalized understanding;Verbal cues required;Returned demonstration;Need further instruction            PT Short Term Goals - 04/03/20 1020      PT SHORT TERM GOAL #1   Title Patient will be independent with initial HEP    Status On-going    Target Date 04/15/20             PT Long Term Goals -  04/03/20 1020      PT LONG TERM GOAL #1   Title Patient will be independent with ongoing/advanced HEP    Status On-going    Target Date 05/13/20      PT LONG TERM GOAL #2   Title Patient to improve tissue quality as noted by reduced tissue tightness and tenderness to palpation    Status On-going    Target Date 05/13/20      PT LONG TERM GOAL #3   Title Patient to improve R shoulder AROM to WNL without pain provocation    Status On-going    Target Date 05/13/20      PT LONG TERM GOAL #4   Title Patient will demonstrate improved R shoulder strength to >/= 4+ to 5/5 for functional UE use    Status On-going    Target Date 05/13/20      PT LONG TERM GOAL #5   Title Patient to report ability to perform ADLs and household chores/tasks without increased pain >1-2/10    Status On-going    Target Date 05/13/20      PT LONG TERM GOAL #6   Title Patient will be able to resume running for exercise w/o increased R shoulder pain    Status On-going                 Plan - 04/10/20 0808    Clinical Impression Statement Chelsea Stafford reports increased soreness on Sat which she attributes to a combination of 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 Pain today now more in lateral neck and subscapular. Continued increase muscle tension evident in rhomboids and subscapularis which was addressed with MT and review of stretch with additional variations of rhomboid stretch demonstrated. Progressed scapular stabilization and strengthening with good tolerance - HEP update provided.    Personal Factors and Comorbidities Time since onset of injury/illness/exacerbation;Past/Current Experience    Examination-Activity Limitations Caring for Others;Lift;Carry;Reach Overhead;Locomotion Level    Examination-Participation Restrictions Cleaning;Community Activity;Driving;Laundry;Meal Prep;Other   Running for workout   Rehab Potential Good     PT Frequency 2x / week    PT Duration 6 weeks    PT Treatment/Interventions ADLs/Self Care Home Management;Cryotherapy;Electrical Stimulation;Iontophoresis 4mg /ml Dexamethasone;Moist Heat;Traction;Ultrasound;Functional mobility training;Therapeutic activities;Therapeutic exercise;Neuromuscular re-education;Patient/family education;Manual techniques;Passive range of motion;Dry needling;Taping;Vasopneumatic Device;Spinal Manipulations;Joint Manipulations    PT Next Visit Plan manual therapy with DN as benefit noted to address increased muscle tension; neck/shoulder stretching/flexibility; postural strengthening; modalities PRN    Consulted and Agree with Plan of Care  Patient           Patient will benefit from skilled therapeutic intervention in order to improve the following deficits and impairments:  Decreased activity tolerance, Decreased endurance, Decreased range of motion, Decreased strength, Increased fascial restricitons, Increased muscle spasms, Impaired perceived functional ability, Impaired flexibility, Impaired UE functional use, Improper body mechanics, Postural dysfunction, Pain  Visit Diagnosis: Chronic right shoulder pain  Other muscle spasm  Other symptoms and signs involving the musculoskeletal system  Muscle weakness (generalized)  Abnormal posture     Problem List Patient Active Problem List   Diagnosis Date Noted  . Right shoulder pain 07/02/2017  . Preventative health care 08/02/2015  . Paresthesia 06/17/2015    Percival Spanish, PT, MPT 04/10/2020, 1:28 PM  [facility] [address]  [address] [place], [place], [address] Phone: [phone]   Fax:  [phone]  Name: Chelsea Stafford MRN: [id number] Date of Birth: [birth year]

## 2020-04-12 ENCOUNTER — Ambulatory Visit: Payer: 59

## 2020-04-12 ENCOUNTER — Other Ambulatory Visit: Payer: Self-pay

## 2020-04-12 DIAGNOSIS — M62838 Other muscle spasm: Secondary | ICD-10-CM

## 2020-04-12 DIAGNOSIS — R29898 Other symptoms and signs involving the musculoskeletal system: Secondary | ICD-10-CM

## 2020-04-12 DIAGNOSIS — G8929 Other chronic pain: Secondary | ICD-10-CM

## 2020-04-12 DIAGNOSIS — R252 Cramp and spasm: Secondary | ICD-10-CM

## 2020-04-12 DIAGNOSIS — R293 Abnormal posture: Secondary | ICD-10-CM

## 2020-04-12 DIAGNOSIS — M25511 Pain in right shoulder: Secondary | ICD-10-CM | POA: Diagnosis not present

## 2020-04-12 DIAGNOSIS — M6281 Muscle weakness (generalized): Secondary | ICD-10-CM

## 2020-04-12 NOTE — Therapy (Signed)
[facility] [address]  [place], [place], [address] Phone: [phone]   Fax:  [phone]  Physical Therapy Treatment  Patient Details  Name: Chelsea Stafford MRN: [id number] Date of Birth: 1980-11-14 Referring Provider (PT): Azucena Cecil, MD   Encounter Date: 04/12/2020   PT End of Session - 04/12/20 0935    Visit Number 4    Number of Visits 12    Date for PT Re-Evaluation 05/13/20    Authorization Type UHC    PT Start Time 0933    PT Stop Time 1014    PT Time Calculation (min) 41 min    Activity Tolerance Patient tolerated treatment well    Behavior During Therapy [facility] for tasks assessed/performed           Past Medical History:  Diagnosis Date   History of Bell's palsy 1999    No past surgical history on file.  There were no vitals filed for this visit.   Subjective Assessment - 04/12/20 0936    Subjective Pt reports she felt good yesterday, so she forgot to do her exercises. She is now setting an alarm to do them. Her shoulder felt really good last night. She reports "there's still a little there, but it's miles better than what it's been".    Diagnostic tests R shoulder x-ray 02/20/20: No acute or structural abnormalities.  R shoulder MRI 03/16/20: Very mild appearing supraspinatus and infraspinatus tendinopathy without tear. Trace amount of subacromial/subdeltoid fluid may be due to bursitis.    Patient Stated Goals "reduce the pain and increase my mobility"    Currently in Pain? No/denies                             [facility] Adult PT Treatment/Exercise - 04/12/20 0001      Shoulder Exercises: Standing   External Rotation Both;10 reps;Theraband;Strengthening    Theraband Level (Shoulder External Rotation) Level 2 (Red)    External Rotation Limitations + scap retraction     Extension Both;10 reps;Strengthening;Theraband    Theraband Level (Shoulder Extension) Level 2 (Red)     Extension Limitations cues for scap retraction/depression + slight ER with shoulder extension to neutral    Row Both;10 reps;Strengthening;Theraband    Theraband Level (Shoulder Row) Level 2 (Red)    Row Limitations cues for scap retraction & depression, avoiding excessive shoulder extension or upward rotation of elbows      Shoulder Exercises: ROM/Strengthening   UBE (Upper Arm Bike) L1.5 x 6 min (3' fwd/3'back)    Other ROM/Strengthening Exercises Red TB shawl crossed behind back - scap retraction & depression 10 x 5"    Other ROM/Strengthening Exercises HBH chest opener 2 x 20"      Manual Therapy   Manual Therapy Soft tissue mobilization;Myofascial release;Scapular mobilization    Soft tissue mobilization STM to R UT, LS, scalenes, rhomboids & subscapularis    Myofascial Release manual TPR to R rhomboids and subscapularis    Scapular Mobilization R medial scapular gapping; R scap mobs all directions                    PT Short Term Goals - 04/03/20 1020      PT SHORT TERM GOAL #1   Title Patient will be independent with initial HEP    Status On-going    Target Date 04/15/20  PT Long Term Goals - 04/03/20 1020      PT LONG TERM GOAL #1   Title Patient will be independent with ongoing/advanced HEP    Status On-going    Target Date 05/13/20      PT LONG TERM GOAL #2   Title Patient to improve tissue quality as noted by reduced tissue tightness and tenderness to palpation    Status On-going    Target Date 05/13/20      PT LONG TERM GOAL #3   Title Patient to improve R shoulder AROM to WNL without pain provocation    Status On-going    Target Date 05/13/20      PT LONG TERM GOAL #4   Title Patient will demonstrate improved R shoulder strength to >/= 4+ to 5/5 for functional UE use    Status On-going    Target Date 05/13/20      PT LONG TERM GOAL #5   Title Patient to report ability to perform ADLs and household chores/tasks without increased  pain >1-2/10    Status On-going    Target Date 05/13/20      PT LONG TERM GOAL #6   Title Patient will be able to resume running for exercise w/o increased R shoulder pain    Status On-going                 Plan - 04/12/20 0936    Clinical Impression Statement Pt presents with no pain today. She did report some crunching and discomfort in anterior shoulder at end of session while circling her shoulder around. Pt was educated to stretch pecs throughout the day, as her form was good with all exercises, to maintain openness to the shoulder. Also, edu about cervical posture to reduce stress oino UT.    Personal Factors and Comorbidities Time since onset of injury/illness/exacerbation;Past/Current Experience    Examination-Activity Limitations Caring for Others;Lift;Carry;Reach Overhead;Locomotion Level    Examination-Participation Restrictions Cleaning;Community Activity;Driving;Laundry;Meal Prep;Other   Running for workout   Rehab Potential Good    PT Frequency 2x / week    PT Duration 6 weeks    PT Treatment/Interventions ADLs/Self Care Home Management;Cryotherapy;Electrical Stimulation;Iontophoresis 4mg /ml Dexamethasone;Moist Heat;Traction;Ultrasound;Functional mobility training;Therapeutic activities;Therapeutic exercise;Neuromuscular re-education;Patient/family education;Manual techniques;Passive range of motion;Dry needling;Taping;Vasopneumatic Device;Spinal Manipulations;Joint Manipulations    PT Next Visit Plan manual therapy with DN as benefit noted to address increased muscle tension; neck/shoulder stretching/flexibility; postural strengthening; modalities PRN    Consulted and Agree with Plan of Care Patient           Patient will benefit from skilled therapeutic intervention in order to improve the following deficits and impairments:  Decreased activity tolerance, Decreased endurance, Decreased range of motion, Decreased strength, Increased fascial restricitons, Increased  muscle spasms, Impaired perceived functional ability, Impaired flexibility, Impaired UE functional use, Improper body mechanics, Postural dysfunction, Pain  Visit Diagnosis: Chronic right shoulder pain  Other muscle spasm  Other symptoms and signs involving the musculoskeletal system  Muscle weakness (generalized)  Abnormal posture  Cramp and spasm     Problem List Patient Active Problem List   Diagnosis Date Noted   Right shoulder pain 07/02/2017   Preventative health care 08/02/2015   Paresthesia 06/17/2015    Izell Shoshoni, PT, DPT 04/12/2020, 10:15 AM  [facility] [address]  [place], [place], [address] Phone: [phone]   Fax:  [phone]  Name: Yu Cragun MRN: [id number] Date of Birth: [birth year]

## 2020-04-16 ENCOUNTER — Encounter: Payer: Self-pay | Admitting: Physical Therapy

## 2020-04-16 ENCOUNTER — Other Ambulatory Visit: Payer: Self-pay

## 2020-04-16 ENCOUNTER — Ambulatory Visit: Payer: 59 | Admitting: Physical Therapy

## 2020-04-16 DIAGNOSIS — G8929 Other chronic pain: Secondary | ICD-10-CM

## 2020-04-16 DIAGNOSIS — M6281 Muscle weakness (generalized): Secondary | ICD-10-CM

## 2020-04-16 DIAGNOSIS — M25511 Pain in right shoulder: Secondary | ICD-10-CM | POA: Diagnosis not present

## 2020-04-16 DIAGNOSIS — R29898 Other symptoms and signs involving the musculoskeletal system: Secondary | ICD-10-CM

## 2020-04-16 DIAGNOSIS — M62838 Other muscle spasm: Secondary | ICD-10-CM

## 2020-04-16 DIAGNOSIS — R293 Abnormal posture: Secondary | ICD-10-CM

## 2020-04-16 NOTE — Therapy (Signed)
[facility] [address]  [place], [place], [address] Phone: [phone]   Fax:  [phone]  Physical Therapy Treatment  Patient Details  Name: Chelsea Stafford MRN: [id number] Date of Birth: 1980/11/07 Referring Provider (PT): Azucena Cecil, MD   Encounter Date: 04/16/2020   PT End of Session - 04/16/20 0934    Visit Number 5    Number of Visits 12    Date for PT Re-Evaluation 05/13/20    Authorization Type UHC    PT Start Time 0934    PT Stop Time 1015    PT Time Calculation (min) 41 min    Activity Tolerance Patient tolerated treatment well    Behavior During Therapy [facility] for tasks assessed/performed           Past Medical History:  Diagnosis Date  . History of Bell's palsy 1999    History reviewed. No pertinent surgical history.  There were no vitals filed for this visit.   Subjective Assessment - 04/16/20 0937    Subjective Pt reports pain slightly more than last visit but still better than when she first started PT. She still finds herself guarding to avoid pain.    Diagnostic tests R shoulder x-ray 02/20/20: No acute or structural abnormalities.  R shoulder MRI 03/16/20: Very mild appearing supraspinatus and infraspinatus tendinopathy without tear. Trace amount of subacromial/subdeltoid fluid may be due to bursitis.    Patient Stated Goals "reduce the pain and increase my mobility"    Currently in Pain? Yes    Pain Score 1    1.5/10   Pain Location Shoulder    Pain Orientation Right;Upper    Pain Descriptors / Indicators Jabbing;Pressure    Pain Type Chronic pain    Pain Frequency Intermittent                             [facility] Adult PT Treatment/Exercise - 04/16/20 0934      Shoulder Exercises: Prone   Other Prone Exercises Scapular pushup in POE x 10      Shoulder Exercises: Sidelying   Other Sidelying Exercises Open book stretch & bent elbow shoulder & thoracic rotation 10 x 5"  each - pt reporting better stretch with open book stretch      Shoulder Exercises: ROM/Strengthening   UBE (Upper Arm Bike) L2.0 x 6 min (3' fwd/3'back)      Manual Therapy   Manual Therapy Soft tissue mobilization;Myofascial release;Scapular mobilization    Manual therapy comments skilled palpation and monitoring during DN    Soft tissue mobilization STM to R UT, LS, rhomboids, subscapularis, infraspinatus and teres group/lats    Myofascial Release manual TPR to R LS, rhomboids, subscapularis, infraspinatus & teres group    Scapular Mobilization R medial scapular gapping; R scap mobs all directions            Trigger Point Dry Needling - 04/16/20 0934    Consent Given? Yes    Muscles Treated Head and Neck Upper trapezius;Levator scapulae   Rt   Muscles Treated Upper Quadrant Rhomboids;Subscapularis;Infraspinatus;Teres major;Teres minor;Latissimus dorsi   Rt   Upper Trapezius Response Twitch reponse elicited;Palpable increased muscle length    Levator Scapulae Response Twitch response elicited;Palpable increased muscle length    Rhomboids Response Twitch response elicited;Palpable increased muscle length    Infraspinatus Response Twitch response elicited;Palpable increased muscle length    Subscapularis Response Twitch response elicited;Palpable increased  muscle length    Latissimus dorsi Response Twitch response elicited;Palpable increased muscle length    Teres major Response Twitch response elicited;Palpable increased muscle length    Teres minor Response Twitch response elicited;Palpable increased muscle length                  PT Short Term Goals - 04/16/20 1013      PT SHORT TERM GOAL #1   Title Patient will be independent with initial HEP    Status Achieved   04/16/20            PT Long Term Goals - 04/03/20 1020      PT LONG TERM GOAL #1   Title Patient will be independent with ongoing/advanced HEP    Status On-going    Target Date 05/13/20      PT LONG  TERM GOAL #2   Title Patient to improve tissue quality as noted by reduced tissue tightness and tenderness to palpation    Status On-going    Target Date 05/13/20      PT LONG TERM GOAL #3   Title Patient to improve R shoulder AROM to WNL without pain provocation    Status On-going    Target Date 05/13/20      PT LONG TERM GOAL #4   Title Patient will demonstrate improved R shoulder strength to >/= 4+ to 5/5 for functional UE use    Status On-going    Target Date 05/13/20      PT LONG TERM GOAL #5   Title Patient to report ability to perform ADLs and household chores/tasks without increased pain >1-2/10    Status On-going    Target Date 05/13/20      PT LONG TERM GOAL #6   Title Patient will be able to resume running for exercise w/o increased R shoulder pain    Status On-going                 Plan - 04/16/20 0941    Clinical Impression Statement 9:30 - Chelsea Stafford reports some increased pain today relative to last visit but overall improved since start of PT. She reports good comfort with initial HEP and denies need for further review at this time - STG met. She continues to note greatest tenderness in upper and medial R scapular muscles but increased ttp also noted in infraspinatus and teres group today - addressed increased muscle tension in theses muscles with manual therapy incorporating DN with good twitch responses elicited resulting in palpable reduction in muscle tension. Targeted stretching and strengthening exercises to promote maintenance of improved muscle tension as well as provide for improved scapular stabilization. Pt denying pain or need for modalities by end of session.    Personal Factors and Comorbidities Time since onset of injury/illness/exacerbation;Past/Current Experience    Examination-Activity Limitations Caring for Others;Lift;Carry;Reach Overhead;Locomotion Level    Examination-Participation Restrictions Cleaning;Community Activity;Driving;Laundry;Meal  Prep;Other   Running for workout   Rehab Potential Good    PT Frequency 2x / week    PT Duration 6 weeks    PT Treatment/Interventions ADLs/Self Care Home Management;Cryotherapy;Electrical Stimulation;Iontophoresis 4mg /ml Dexamethasone;Moist Heat;Traction;Ultrasound;Functional mobility training;Therapeutic activities;Therapeutic exercise;Neuromuscular re-education;Patient/family education;Manual techniques;Passive range of motion;Dry needling;Taping;Vasopneumatic Device;Spinal Manipulations;Joint Manipulations    PT Next Visit Plan manual therapy with DN as benefit noted to address increased muscle tension; neck/shoulder stretching/flexibility; postural strengthening; modalities PRN    Consulted and Agree with Plan of Care Patient           Patient will  benefit from skilled therapeutic intervention in order to improve the following deficits and impairments:  Decreased activity tolerance, Decreased endurance, Decreased range of motion, Decreased strength, Increased fascial restricitons, Increased muscle spasms, Impaired perceived functional ability, Impaired flexibility, Impaired UE functional use, Improper body mechanics, Postural dysfunction, Pain  Visit Diagnosis: Chronic right shoulder pain  Other muscle spasm  Other symptoms and signs involving the musculoskeletal system  Muscle weakness (generalized)  Abnormal posture     Problem List Patient Active Problem List   Diagnosis Date Noted  . Right shoulder pain 07/02/2017  . Preventative health care 08/02/2015  . Paresthesia 06/17/2015    Percival Spanish, PT, MPT 04/16/2020, 10:25 AM  [facility] [address]  [place], [place], [address] Phone: [phone]   Fax:  [phone]  Name: Chelsea Stafford MRN: [id number] Date of Birth: 06-Aug-1980

## 2020-04-19 ENCOUNTER — Ambulatory Visit: Payer: 59 | Attending: Orthopaedic Surgery

## 2020-04-19 ENCOUNTER — Other Ambulatory Visit: Payer: Self-pay

## 2020-04-19 DIAGNOSIS — R29898 Other symptoms and signs involving the musculoskeletal system: Secondary | ICD-10-CM | POA: Diagnosis present

## 2020-04-19 DIAGNOSIS — R252 Cramp and spasm: Secondary | ICD-10-CM | POA: Diagnosis present

## 2020-04-19 DIAGNOSIS — M62838 Other muscle spasm: Secondary | ICD-10-CM | POA: Insufficient documentation

## 2020-04-19 DIAGNOSIS — M25511 Pain in right shoulder: Secondary | ICD-10-CM | POA: Diagnosis not present

## 2020-04-19 DIAGNOSIS — G8929 Other chronic pain: Secondary | ICD-10-CM | POA: Diagnosis present

## 2020-04-19 DIAGNOSIS — M6281 Muscle weakness (generalized): Secondary | ICD-10-CM | POA: Diagnosis present

## 2020-04-19 DIAGNOSIS — R293 Abnormal posture: Secondary | ICD-10-CM | POA: Diagnosis present

## 2020-04-19 NOTE — Therapy (Signed)
[facility] [address]  [place], [place], [address] Phone: [phone]   Fax:  [phone]  Physical Therapy Treatment  Patient Details  Name: Chelsea Stafford MRN: [id number] Date of Birth: [birth year] Referring Provider (PT): Azucena Cecil, MD   Encounter Date: 04/19/2020   PT End of Session - 04/19/20 0938    Visit Number 6    Number of Visits 12    Date for PT Re-Evaluation 05/13/20    Authorization Type UHC    PT Start Time 0932    PT Stop Time 1015    PT Time Calculation (min) 43 min    Activity Tolerance Patient tolerated treatment well    Behavior During Therapy [facility] for tasks assessed/performed           Past Medical History:  Diagnosis Date  . History of Bell's palsy 1999    No past surgical history on file.  There were no vitals filed for this visit.   Subjective Assessment - 04/19/20 0934    Subjective Pt. did not sleep well last night due to dogs sleeping with her in bed.    Diagnostic tests R shoulder x-ray 02/20/20: No acute or structural abnormalities.  R shoulder MRI 03/16/20: Very mild appearing supraspinatus and infraspinatus tendinopathy without tear. Trace amount of subacromial/subdeltoid fluid may be due to bursitis.    Patient Stated Goals "reduce the pain and increase my mobility"    Currently in Pain? No/denies    Pain Score 0-No pain   "pinching" pain 1-2/10 pain   Pain Location Shoulder    Pain Orientation Right;Upper    Pain Descriptors / Indicators Stabbing    Pain Type Chronic pain    Pain Frequency Intermittent                             [facility] Adult PT Treatment/Exercise - 04/19/20 0001      Shoulder Exercises: Supine   External Rotation 15 reps;Theraband;Both;Strengthening    Theraband Level (Shoulder External Rotation) Level 2 (Red)    External Rotation Limitations Hooklying     Flexion Right;Left;10 reps;Strengthening    Theraband Level (Shoulder  Flexion) Level 2 (Red)    Flexion Limitations Hookling alternating flexion/extension       Shoulder Exercises: Standing   Extension Both;15 reps;Theraband;Strengthening    Theraband Level (Shoulder Extension) Level 2 (Red)    Extension Limitations tactile cueing for increased scapular retraction       Shoulder Exercises: Pulleys   Flexion 3 minutes    Scaption 3 minutes      Shoulder Exercises: ROM/Strengthening   Cybex Row Limitations 10# x 10 reps     Wall Pushups 15 reps    Wall Pushups Limitations + serrutus push       Shoulder Exercises: Stretch   Corner Stretch 2 reps;30 seconds    Corner Stretch Limitations low and single arm mid stretch to tolerance       Manual Therapy   Manual Therapy Soft tissue mobilization;Myofascial release    Manual therapy comments seated     Soft tissue mobilization STM/DTM to R UT    Myofascial Release TPR to R UT                    PT Short Term Goals - 04/16/20 1013      PT SHORT TERM GOAL #1   Title Patient will be independent  with initial HEP    Status Achieved   04/16/20            PT Long Term Goals - 04/03/20 1020      PT LONG TERM GOAL #1   Title Patient will be independent with ongoing/advanced HEP    Status On-going    Target Date 05/13/20      PT LONG TERM GOAL #2   Title Patient to improve tissue quality as noted by reduced tissue tightness and tenderness to palpation    Status On-going    Target Date 05/13/20      PT LONG TERM GOAL #3   Title Patient to improve R shoulder AROM to WNL without pain provocation    Status On-going    Target Date 05/13/20      PT LONG TERM GOAL #4   Title Patient will demonstrate improved R shoulder strength to >/= 4+ to 5/5 for functional UE use    Status On-going    Target Date 05/13/20      PT LONG TERM GOAL #5   Title Patient to report ability to perform ADLs and household chores/tasks without increased pain >1-2/10    Status On-going    Target Date 05/13/20        PT LONG TERM GOAL #6   Title Patient will be able to resume running for exercise w/o increased R shoulder pain    Status On-going                 Plan - 04/19/20 0945    Clinical Impression Statement [place] reporting 30% improvement in pain since starting therapy.  Tolerated progression of periscapular strengthening and anterior chest stretching well.  Did have intermittent pain in R superior shoulder with therex which subsided with rest.  With visible forward hunched posture at times in session with cueing for scapular retraction required.  Will continue to progress toward goals.    Rehab Potential Good    PT Frequency 2x / week    PT Duration 6 weeks    PT Treatment/Interventions ADLs/Self Care Home Management;Cryotherapy;Electrical Stimulation;Iontophoresis 4mg /ml Dexamethasone;Moist Heat;Traction;Ultrasound;Functional mobility training;Therapeutic activities;Therapeutic exercise;Neuromuscular re-education;Patient/family education;Manual techniques;Passive range of motion;Dry needling;Taping;Vasopneumatic Device;Spinal Manipulations;Joint Manipulations    PT Next Visit Plan manual therapy with DN as benefit noted to address increased muscle tension; neck/shoulder stretching/flexibility; postural strengthening; modalities PRN    Consulted and Agree with Plan of Care Patient           Patient will benefit from skilled therapeutic intervention in order to improve the following deficits and impairments:  Decreased activity tolerance, Decreased endurance, Decreased range of motion, Decreased strength, Increased fascial restricitons, Increased muscle spasms, Impaired perceived functional ability, Impaired flexibility, Impaired UE functional use, Improper body mechanics, Postural dysfunction, Pain  Visit Diagnosis: Chronic right shoulder pain  Other muscle spasm  Other symptoms and signs involving the musculoskeletal system  Muscle weakness (generalized)  Abnormal  posture     Problem List Patient Active Problem List   Diagnosis Date Noted  . Right shoulder pain 07/02/2017  . Preventative health care 08/02/2015  . Paresthesia 06/17/2015    Bess Harvest, PTA 04/19/20 12:46 PM   [facility] [address]  [place], [place], [address] Phone: [phone]   Fax:  [phone]  Name: Chelsea Stafford MRN: [id number] Date of Birth: 09-17-1980

## 2020-04-23 ENCOUNTER — Other Ambulatory Visit: Payer: Self-pay

## 2020-04-23 ENCOUNTER — Ambulatory Visit: Payer: 59

## 2020-04-23 DIAGNOSIS — R29898 Other symptoms and signs involving the musculoskeletal system: Secondary | ICD-10-CM

## 2020-04-23 DIAGNOSIS — M6281 Muscle weakness (generalized): Secondary | ICD-10-CM

## 2020-04-23 DIAGNOSIS — M62838 Other muscle spasm: Secondary | ICD-10-CM

## 2020-04-23 DIAGNOSIS — M25511 Pain in right shoulder: Secondary | ICD-10-CM | POA: Diagnosis not present

## 2020-04-23 DIAGNOSIS — G8929 Other chronic pain: Secondary | ICD-10-CM

## 2020-04-23 DIAGNOSIS — R293 Abnormal posture: Secondary | ICD-10-CM

## 2020-04-23 NOTE — Therapy (Signed)
[facility] [address]  [place], [place], [address] Phone: [phone]   Fax:  [phone]  Physical Therapy Treatment  Patient Details  Name: Chelsea Stafford MRN: [id number] Date of Birth: 16-Nov-1980 Referring Provider (PT): Azucena Cecil, MD   Encounter Date: 04/23/2020   PT End of Session - 04/23/20 0936    Visit Number 7    Number of Visits 12    Date for PT Re-Evaluation 05/13/20    Authorization Type UHC    PT Start Time 0930    PT Stop Time 1011    PT Time Calculation (min) 41 min    Activity Tolerance Patient tolerated treatment well    Behavior During Therapy [facility] for tasks assessed/performed           Past Medical History:  Diagnosis Date  . History of Bell's palsy 1999    History reviewed. No pertinent surgical history.  There were no vitals filed for this visit.   Subjective Assessment - 04/23/20 0934    Subjective Pt. noting improved mobility reaching overhead out to side and forward since starting therapy.  "I can go further before it begins to hurt".    Diagnostic tests R shoulder x-ray 02/20/20: No acute or structural abnormalities.  R shoulder MRI 03/16/20: Very mild appearing supraspinatus and infraspinatus tendinopathy without tear. Trace amount of subacromial/subdeltoid fluid may be due to bursitis.    Patient Stated Goals "reduce the pain and increase my mobility"    Currently in Pain? No/denies    Pain Score 0-No pain   pain up to 2/10   Pain Location Shoulder    Pain Orientation Right;Upper    Pain Descriptors / Indicators Stabbing    Pain Type Chronic pain    Pain Frequency Intermittent    Multiple Pain Sites No                             [facility] Adult PT Treatment/Exercise - 04/23/20 0001      Shoulder Exercises: Supine   Horizontal ABduction Both;10 reps;Theraband;Strengthening    Theraband Level (Shoulder Horizontal ABduction) Level 3 (Green)    Horizontal  ABduction Limitations Hooklying     Flexion Right;Left;10 reps;Strengthening    Theraband Level (Shoulder Flexion) Level 3 (Green)    Flexion Limitations Hookling alternating flexion/extension       Shoulder Exercises: Standing   External Rotation Both;10 reps;Theraband;Strengthening    Theraband Level (Shoulder External Rotation) Level 2 (Red)    Row Both;10 reps;Strengthening;Theraband    Theraband Level (Shoulder Row) Level 3 (Green)      Shoulder Exercises: ROM/Strengthening   UBE (Upper Arm Bike) L2.0 x 6 min (3' fwd/3'back)      Shoulder Exercises: Stretch   Internal Rotation Stretch Limitations R Sleeper stretch 2 x 20 sec     Other Shoulder Stretches R posterior shoulder stretch x 30 sec      Manual Therapy   Manual Therapy Soft tissue mobilization;Myofascial release    Manual therapy comments seated     Soft tissue mobilization STM/DTM to R UT, LS, lats, infra, deltoids     Myofascial Release TPR to infraspinatus, UT      Neck Exercises: Stretches   Upper Trapezius Stretch Right;Left;1 rep;30 seconds                  PT Education - 04/23/20 1023    Education Details HEP update:  sleeper stretch    Person(s) Educated Patient    Methods Explanation;Demonstration;Verbal cues;Handout    Comprehension Verbalized understanding;Returned demonstration;Verbal cues required            PT Short Term Goals - 04/16/20 1013      PT SHORT TERM GOAL #1   Title Patient will be independent with initial HEP    Status Achieved   04/16/20            PT Long Term Goals - 04/03/20 1020      PT LONG TERM GOAL #1   Title Patient will be independent with ongoing/advanced HEP    Status On-going    Target Date 05/13/20      PT LONG TERM GOAL #2   Title Patient to improve tissue quality as noted by reduced tissue tightness and tenderness to palpation    Status On-going    Target Date 05/13/20      PT LONG TERM GOAL #3   Title Patient to improve R shoulder AROM to WNL  without pain provocation    Status On-going    Target Date 05/13/20      PT LONG TERM GOAL #4   Title Patient will demonstrate improved R shoulder strength to >/= 4+ to 5/5 for functional UE use    Status On-going    Target Date 05/13/20      PT LONG TERM GOAL #5   Title Patient to report ability to perform ADLs and household chores/tasks without increased pain >1-2/10    Status On-going    Target Date 05/13/20      PT LONG TERM GOAL #6   Title Patient will be able to resume running for exercise w/o increased R shoulder pain    Status On-going                 Plan - 04/23/20 1008    Clinical Impression Statement Pt. reporting some improved ability to reach out to side and overhead before onset of R shoulder pain with some reduction in intensity as well.  Continued periscapular strengthening for improved scapulohumeral rhythm with overhead motion along with MT for improved R shoulder/scapular tissue quality.  Progressing toward LTG #2.  Pt. tolerated all activities session well today.  ended visit pain free thus modalities deferred.    Rehab Potential Good    PT Frequency 2x / week    PT Duration 6 weeks    PT Treatment/Interventions ADLs/Self Care Home Management;Cryotherapy;Electrical Stimulation;Iontophoresis 4mg /ml Dexamethasone;Moist Heat;Traction;Ultrasound;Functional mobility training;Therapeutic activities;Therapeutic exercise;Neuromuscular re-education;Patient/family education;Manual techniques;Passive range of motion;Dry needling;Taping;Vasopneumatic Device;Spinal Manipulations;Joint Manipulations    PT Next Visit Plan manual therapy with DN as benefit noted to address increased muscle tension; neck/shoulder stretching/flexibility; postural strengthening; modalities PRN    Consulted and Agree with Plan of Care Patient           Patient will benefit from skilled therapeutic intervention in order to improve the following deficits and impairments:  Decreased activity  tolerance, Decreased endurance, Decreased range of motion, Decreased strength, Increased fascial restricitons, Increased muscle spasms, Impaired perceived functional ability, Impaired flexibility, Impaired UE functional use, Improper body mechanics, Postural dysfunction, Pain  Visit Diagnosis: Chronic right shoulder pain  Other muscle spasm  Other symptoms and signs involving the musculoskeletal system  Muscle weakness (generalized)  Abnormal posture     Problem List Patient Active Problem List   Diagnosis Date Noted  . Right shoulder pain 07/02/2017  . Preventative health care 08/02/2015  . Paresthesia 06/17/2015  Bess Harvest, PTA 04/23/20 12:09 PM   [facility] [address]  [place], [place], [address] Phone: [phone]   Fax:  [phone]  Name: Chelsea Stafford MRN: [id number] Date of Birth: [birth year]

## 2020-04-26 ENCOUNTER — Other Ambulatory Visit: Payer: Self-pay

## 2020-04-26 ENCOUNTER — Ambulatory Visit: Payer: 59

## 2020-04-26 DIAGNOSIS — R29898 Other symptoms and signs involving the musculoskeletal system: Secondary | ICD-10-CM

## 2020-04-26 DIAGNOSIS — M62838 Other muscle spasm: Secondary | ICD-10-CM

## 2020-04-26 DIAGNOSIS — M6281 Muscle weakness (generalized): Secondary | ICD-10-CM

## 2020-04-26 DIAGNOSIS — R293 Abnormal posture: Secondary | ICD-10-CM

## 2020-04-26 DIAGNOSIS — M25511 Pain in right shoulder: Secondary | ICD-10-CM | POA: Diagnosis not present

## 2020-04-26 DIAGNOSIS — G8929 Other chronic pain: Secondary | ICD-10-CM
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
## 2020-04-30 ENCOUNTER — Ambulatory Visit: Payer: 59

## 2020-04-30 ENCOUNTER — Other Ambulatory Visit: Payer: Self-pay

## 2020-04-30 DIAGNOSIS — R293 Abnormal posture: Secondary | ICD-10-CM

## 2020-04-30 DIAGNOSIS — M25511 Pain in right shoulder: Secondary | ICD-10-CM

## 2020-04-30 DIAGNOSIS — G8929 Other chronic pain: Secondary | ICD-10-CM

## 2020-04-30 DIAGNOSIS — M6281 Muscle weakness (generalized): Secondary | ICD-10-CM

## 2020-04-30 DIAGNOSIS — M62838 Other muscle spasm: Secondary | ICD-10-CM

## 2020-04-30 DIAGNOSIS — R29898 Other symptoms and signs involving the musculoskeletal system: Secondary | ICD-10-CM

## 2020-04-30 NOTE — Therapy (Signed)
[facility] [address]  [place], [place], [address] Phone: [phone]   Fax:  [phone]  Physical Therapy Treatment  Patient Details  Name: Chelsea Stafford MRN: [id number] Date of Birth: 02/02/1981 Referring Provider (PT): Azucena Cecil, MD   Encounter Date: 04/30/2020   PT End of Session - 04/30/20 0934    Visit Number 9    Number of Visits 12    Date for PT Re-Evaluation 05/13/20    Authorization Type UHC    PT Start Time 0931    PT Stop Time 1016    PT Time Calculation (min) 45 min    Activity Tolerance Patient tolerated treatment well    Behavior During Therapy [facility] for tasks assessed/performed           Past Medical History:  Diagnosis Date  . History of Bell's palsy 1999    No past surgical history on file.  There were no vitals filed for this visit.   Subjective Assessment - 04/30/20 0937    Subjective notes still having intermittent "sharp" pain over weekend when reaching overhead.    Diagnostic tests R shoulder x-ray 02/20/20: No acute or structural abnormalities.  R shoulder MRI 03/16/20: Very mild appearing supraspinatus and infraspinatus tendinopathy without tear. Trace amount of subacromial/subdeltoid fluid may be due to bursitis.    Patient Stated Goals "reduce the pain and increase my mobility"    Currently in Pain? Yes    Pain Score 1     Pain Location Shoulder    Pain Orientation Right    Pain Descriptors / Indicators Dull    Pain Type Chronic pain    Aggravating Factors  reaching overhead    Multiple Pain Sites No              [facility] PT Assessment - 04/30/20 0001      Strength   Strength Assessment Site Shoulder    Right/Left Shoulder Right;Left    Right Shoulder Flexion 4+/5    Right Shoulder ABduction 4+/5    Right Shoulder Internal Rotation 4+/5    Right Shoulder External Rotation 4+/5    Left Shoulder Flexion 4+/5    Left Shoulder ABduction 4+/5    Left Shoulder  Internal Rotation 4+/5    Left Shoulder External Rotation 4+/5                         [facility] Adult PT Treatment/Exercise - 04/30/20 0001      Shoulder Exercises: Prone   Retraction Right;10 reps;Weights;Strengthening    Retraction Weight (lbs) 2    Retraction Limitations cues for scapular retraction     Flexion Right;10 reps;Strengthening    Flexion Limitations Prone "Y" on mat table     Extension Right;10 reps;Weights    Extension Weight (lbs) 1    Extension Limitations "I's leaning on mat table     External Rotation Right;15 reps;Strengthening    External Rotation Limitations prone "W" R only on mat table     Horizontal ABduction 1 Right;10 reps;Strengthening;Weights    Horizontal ABduction 1 Weight (lbs) 1    Horizontal ABduction 1 Limitations "T's" prone on table       Shoulder Exercises: Sidelying   ABduction Right;10 reps;Weights;Strengthening    ABduction Weight (lbs) 2      Shoulder Exercises: Standing   Flexion Right;10 reps;AROM    Flexion Limitations pain free reach to 2nd cabinet shelf overhead  ABduction Right;5 reps;AROM    ABduction Limitations 3/10 R shoulder/UT pain with full abduction 2nd shelf overhead cabinet reach thus terminated       Shoulder Exercises: ROM/Strengthening   UBE (Upper Arm Bike) L2.5 x 6 min (3' fwd/3'back)      Manual Therapy   Manual Therapy Soft tissue mobilization;Myofascial release;Scapular mobilization    Manual therapy comments seated, prone and sidelying     Soft tissue mobilization DTM to R superior rhomboids and UT,     Myofascial Release R TPR to R mid trap, rhomboids     Scapular Mobilization R scapular mobs all directions       Neck Exercises: Stretches   Upper Trapezius Stretch Right;1 rep;30 seconds    Levator Stretch Right;1 rep;30 seconds                  PT Education - 04/30/20 1150    Education Details HEP update; STM self-ball to R infraspinatus in area of tenderness    Person(s)  Educated Patient    Methods Explanation;Demonstration;Verbal cues;Handout    Comprehension Verbalized understanding;Returned demonstration;Verbal cues required            PT Short Term Goals - 04/16/20 1013      PT SHORT TERM GOAL #1   Title Patient will be independent with initial HEP    Status Achieved   04/16/20            PT Long Term Goals - 04/30/20 1007      PT LONG TERM GOAL #1   Title Patient will be independent with ongoing/advanced HEP    Status On-going      PT LONG TERM GOAL #2   Title Patient to improve tissue quality as noted by reduced tissue tightness and tenderness to palpation    Status On-going      PT LONG TERM GOAL #3   Title Patient to improve R shoulder AROM to WNL without pain provocation    Status On-going      PT LONG TERM GOAL #4   Title Patient will demonstrate improved R shoulder strength to >/= 4+ to 5/5 for functional UE use    Status Achieved      PT LONG TERM GOAL #5   Title Patient to report ability to perform ADLs and household chores/tasks without increased pain >1-2/10    Status On-going   04/30/20:  2-3/10 with household chores/tasks; 3-4/10 pain reaching and taking boxes down from shelves     PT LONG TERM GOAL #6   Title Patient will be able to resume running for exercise w/o increased R shoulder pain    Status On-going                 Plan - 04/30/20 [place] reporting she took down 10-20 lb boxes out of closet over weekend and ended up having some "sharp" R shoulder pain during this task.  MT addressing R UT, rhomboids, infraspinatus tenderness with some improvement in tissue quality noted.  Able to perform 10 reps flexion overhead reach to 2nd cabinet shelf without pain however reproduced R UT pain with abduction reach to overhead shelf.  Does report that her shoulder pain has reduced in intensity by 35% since starting therapy and overall 50% improvement in pain.  Able to meet LTG #4  demonstrating at least 4+/5 strength with MMT in B shoulders.    Rehab Potential Good    PT Frequency 2x /  week    PT Duration 6 weeks    PT Treatment/Interventions ADLs/Self Care Home Management;Cryotherapy;Electrical Stimulation;Iontophoresis 4mg /ml Dexamethasone;Moist Heat;Traction;Ultrasound;Functional mobility training;Therapeutic activities;Therapeutic exercise;Neuromuscular re-education;Patient/family education;Manual techniques;Passive range of motion;Dry needling;Taping;Vasopneumatic Device;Spinal Manipulations;Joint Manipulations    PT Next Visit Plan manual therapy with DN as benefit noted to address increased muscle tension; neck/shoulder stretching/flexibility; postural strengthening; modalities PRN    Consulted and Agree with Plan of Care Patient           Patient will benefit from skilled therapeutic intervention in order to improve the following deficits and impairments:  Decreased activity tolerance, Decreased endurance, Decreased range of motion, Decreased strength, Increased fascial restricitons, Increased muscle spasms, Impaired perceived functional ability, Impaired flexibility, Impaired UE functional use, Improper body mechanics, Postural dysfunction, Pain  Visit Diagnosis: Chronic right shoulder pain  Other muscle spasm  Other symptoms and signs involving the musculoskeletal system  Muscle weakness (generalized)  Abnormal posture     Problem List Patient Active Problem List   Diagnosis Date Noted  . Right shoulder pain 07/02/2017  . Preventative health care 08/02/2015  . Paresthesia 06/17/2015    Bess Harvest, PTA 04/30/20 12:14 PM   [facility] [address]  [place], [place], [address] Phone: [phone]   Fax:  [phone]  Name: Chelsea Stafford MRN: [id number] Date of Birth: [birth year]

## 2020-05-03 ENCOUNTER — Other Ambulatory Visit: Payer: Self-pay

## 2020-05-03 ENCOUNTER — Ambulatory Visit: Payer: 59 | Admitting: Physical Therapy

## 2020-05-03 ENCOUNTER — Encounter: Payer: Self-pay | Admitting: Physical Therapy

## 2020-05-03 DIAGNOSIS — R252 Cramp and spasm: Secondary | ICD-10-CM

## 2020-05-03 DIAGNOSIS — M62838 Other muscle spasm: Secondary | ICD-10-CM

## 2020-05-03 DIAGNOSIS — G8929 Other chronic pain: Secondary | ICD-10-CM

## 2020-05-03 DIAGNOSIS — R29898 Other symptoms and signs involving the musculoskeletal system: Secondary | ICD-10-CM

## 2020-05-03 DIAGNOSIS — M25511 Pain in right shoulder: Secondary | ICD-10-CM | POA: Diagnosis not present

## 2020-05-03 DIAGNOSIS — M6281 Muscle weakness (generalized): Secondary | ICD-10-CM

## 2020-05-03 DIAGNOSIS — R293 Abnormal posture: Secondary | ICD-10-CM

## 2020-05-03 NOTE — Therapy (Signed)
[facility] [address]  [place], [place], [address] Phone: [phone]   Fax:  [phone]  Physical Therapy Treatment  Patient Details  Name: Chelsea Stafford MRN: [id number] Date of Birth: Nov 10, 1980 Referring Provider (PT): Azucena Cecil, MD   Encounter Date: 05/03/2020   PT End of Session - 05/03/20 0932    Visit Number 10    Number of Visits 12    Date for PT Re-Evaluation 05/13/20    Authorization Type UHC    PT Start Time 0932    PT Stop Time 1014    PT Time Calculation (min) 42 min    Activity Tolerance Patient tolerated treatment well    Behavior During Therapy [facility] for tasks assessed/performed           Past Medical History:  Diagnosis Date  . History of Bell's palsy 1999    History reviewed. No pertinent surgical history.  There were no vitals filed for this visit.   Subjective Assessment - 05/03/20 0937    Subjective Pt reports yesterday and today she woke up very achy - had been trying to increase her walking in prep for jogging and thinks this may have aggravated her shoulder..    Diagnostic tests R shoulder x-ray 02/20/20: No acute or structural abnormalities.  R shoulder MRI 03/16/20: Very mild appearing supraspinatus and infraspinatus tendinopathy without tear. Trace amount of subacromial/subdeltoid fluid may be due to bursitis.    Patient Stated Goals "reduce the pain and increase my mobility"    Currently in Pain? Yes    Pain Score 1    1.5/10; up to 3/10 upon waking this morning   Pain Location Shoulder    Pain Orientation Right;Upper;Posterior    Pain Descriptors / Indicators Dull;Aching   brief sharp pinching pain with use of shoulder   Pain Type Chronic pain    Pain Frequency Intermittent                             [facility] Adult PT Treatment/Exercise - 05/03/20 0932      Shoulder Exercises: ROM/Strengthening   UBE (Upper Arm Bike) L2.5 x 6 min (3' fwd/3'back)       Manual Therapy   Manual Therapy Soft tissue mobilization;Myofascial release;Scapular mobilization    Manual therapy comments skilled palpation and monitoring during DN    Soft tissue mobilization STM to R UT, LS, rhomboids, subscapularis, infraspinatus and teres group/lats    Myofascial Release manual TPR to R LS, rhomboids, subscapularis, infraspinatus & teres group    Scapular Mobilization R medial scapular gapping; R scap mobs all directions            Trigger Point Dry Needling - 05/03/20 0932    Consent Given? Yes    Muscles Treated Head and Neck Levator scapulae;Upper trapezius;Scalenes   Rt   Muscles Treated Upper Quadrant Rhomboids;Infraspinatus;Subscapularis;Teres major;Teres minor;Latissimus dorsi   Rt   Upper Trapezius Response Twitch reponse elicited;Palpable increased muscle length    Levator Scapulae Response Twitch response elicited;Palpable increased muscle length    Scalenes Response Twitch reponse elicited;Palpable increased muscle length    Rhomboids Response Twitch response elicited;Palpable increased muscle length    Infraspinatus Response Twitch response elicited;Palpable increased muscle length    Subscapularis Response Twitch response elicited;Palpable increased muscle length    Latissimus dorsi Response Twitch response elicited;Palpable increased muscle length    Teres major Response Twitch response elicited;Palpable  increased muscle length    Teres minor Response Twitch response elicited;Palpable increased muscle length                  PT Short Term Goals - 04/16/20 1013      PT SHORT TERM GOAL #1   Title Patient will be independent with initial HEP    Status Achieved   04/16/20            PT Long Term Goals - 04/30/20 1007      PT LONG TERM GOAL #1   Title Patient will be independent with ongoing/advanced HEP    Status On-going      PT LONG TERM GOAL #2   Title Patient to improve tissue quality as noted by reduced tissue tightness and  tenderness to palpation    Status On-going      PT LONG TERM GOAL #3   Title Patient to improve R shoulder AROM to WNL without pain provocation    Status On-going      PT LONG TERM GOAL #4   Title Patient will demonstrate improved R shoulder strength to >/= 4+ to 5/5 for functional UE use    Status Achieved      PT LONG TERM GOAL #5   Title Patient to report ability to perform ADLs and household chores/tasks without increased pain >1-2/10    Status On-going   04/30/20:  2-3/10 with household chores/tasks; 3-4/10 pain reaching and taking boxes down from shelves     PT LONG TERM GOAL #6   Title Patient will be able to resume running for exercise w/o increased R shoulder pain    Status On-going                 Plan - 05/03/20 [place] reports that she felt like her pain had been improving over the past few weeks but as she has increased her walking and has begun to resume jogging, she is noting increased pain again. She states that she has tried to be conscious of keeping her shoulders down while running but is unaware if she is retracting her shoulders to maintain upright posture and neutral shoulder alignment - she will try to be more aware of this next time she walks/jogs. Increased pain with ttp noted throughout R shoulder complex, most pronounced in LS, UT and medial scapular muscles. Today's session focusing on MT incorporating DN addressing majority of periscapular muscles with pt noting relief of pain following DN.    Rehab Potential Good    PT Frequency 2x / week    PT Duration 6 weeks    PT Treatment/Interventions ADLs/Self Care Home Management;Cryotherapy;Electrical Stimulation;Iontophoresis 4mg /ml Dexamethasone;Moist Heat;Traction;Ultrasound;Functional mobility training;Therapeutic activities;Therapeutic exercise;Neuromuscular re-education;Patient/family education;Manual techniques;Passive range of motion;Dry  needling;Taping;Vasopneumatic Device;Spinal Manipulations;Joint Manipulations    PT Next Visit Plan assess response to DN and posutral changes while walking/jogging; neck/shoulder stretching/flexibility; postural strengthening; manual therapy with DN as benefit noted to address increased muscle tension; modalities PRN    Consulted and Agree with Plan of Care Patient           Patient will benefit from skilled therapeutic intervention in order to improve the following deficits and impairments:  Decreased activity tolerance, Decreased endurance, Decreased range of motion, Decreased strength, Increased fascial restricitons, Increased muscle spasms, Impaired perceived functional ability, Impaired flexibility, Impaired UE functional use, Improper body mechanics, Postural dysfunction, Pain  Visit Diagnosis: Chronic right shoulder pain  Other muscle spasm  Other  symptoms and signs involving the musculoskeletal system  Muscle weakness (generalized)  Abnormal posture  Cramp and spasm     Problem List Patient Active Problem List   Diagnosis Date Noted  . Right shoulder pain 07/02/2017  . Preventative health care 08/02/2015  . Paresthesia 06/17/2015    Percival Spanish, PT, MPT 05/03/2020, 1:16 PM  [facility] [address]  [place], [place], [address] Phone: [phone]   Fax:  [phone]  Name: Arneisha Kincannon MRN: [id number] Date of Birth: [birth year]

## 2020-05-07 ENCOUNTER — Other Ambulatory Visit: Payer: Self-pay

## 2020-05-07 ENCOUNTER — Ambulatory Visit: Payer: 59

## 2020-05-07 DIAGNOSIS — R293 Abnormal posture: Secondary | ICD-10-CM

## 2020-05-07 DIAGNOSIS — M6281 Muscle weakness (generalized): Secondary | ICD-10-CM

## 2020-05-07 DIAGNOSIS — R252 Cramp and spasm: Secondary | ICD-10-CM

## 2020-05-07 DIAGNOSIS — M25511 Pain in right shoulder: Secondary | ICD-10-CM | POA: Diagnosis not present

## 2020-05-07 DIAGNOSIS — M62838 Other muscle spasm: Secondary | ICD-10-CM

## 2020-05-07 DIAGNOSIS — G8929 Other chronic pain: Secondary | ICD-10-CM

## 2020-05-07 DIAGNOSIS — R29898 Other symptoms and signs involving the musculoskeletal system: Secondary | ICD-10-CM

## 2020-05-07 NOTE — Therapy (Signed)
[facility] [address]  [place], [place], [address] Phone: [phone]   Fax:  [phone]  Physical Therapy Treatment  Patient Details  Name: Chelsea Stafford MRN: [id number] Date of Birth: 1981/02/19 Referring Provider (PT): Azucena Cecil, MD   Encounter Date: 05/07/2020   PT End of Session - 05/07/20 0854    Visit Number 11    Number of Visits 12    Date for PT Re-Evaluation 05/13/20    Authorization Type UHC    PT Start Time 0845    PT Stop Time 0930    PT Time Calculation (min) 45 min    Activity Tolerance Patient tolerated treatment well    Behavior During Therapy [facility] for tasks assessed/performed           Past Medical History:  Diagnosis Date  . History of Bell's palsy 1999    No past surgical history on file.  There were no vitals filed for this visit.   Subjective Assessment - 05/07/20 0849    Subjective Is frustrated with her R shoulder still hurting reaching out to the side.    Diagnostic tests R shoulder x-ray 02/20/20: No acute or structural abnormalities.  R shoulder MRI 03/16/20: Very mild appearing supraspinatus and infraspinatus tendinopathy without tear. Trace amount of subacromial/subdeltoid fluid may be due to bursitis.    Patient Stated Goals "reduce the pain and increase my mobility"    Currently in Pain? No/denies    Pain Score 0-No pain   up to a 3/10 pain reaching out to side overhead   Pain Location Shoulder    Pain Orientation Right;Upper    Pain Descriptors / Indicators Dull;Aching    Pain Type Chronic pain    Pain Onset More than a month ago    Pain Frequency Intermittent    Aggravating Factors  reaching overhead out to side    Multiple Pain Sites No              [facility] PT Assessment - 05/07/20 0001      AROM   AROM Assessment Site Shoulder    Right/Left Shoulder Right    Right Shoulder Flexion 152 Degrees   R shld discomfort at 140 dg; pain lowering    Right Shoulder  ABduction 166 Degrees   pain starting at 100 dg    Right Shoulder Internal Rotation --   FIR to T6   Right Shoulder External Rotation --   FER to T3- pain                         [facility] Adult PT Treatment/Exercise - 05/07/20 0001      Self-Care   Self-Care Other Self-Care Comments    Other Self-Care Comments  Disussion of sleep hygiene and relationship between poor sleep and chronic pain; discussion of pt. current sources of stress and coping strategies to reduce strain       Shoulder Exercises: Sidelying   External Rotation Right;10 reps;Strengthening;Weights    External Rotation Weight (lbs) 2    ABduction Right;10 reps;Strengthening;Theraband   0-130 dg ROM   ABduction Limitations red TB therapist anchored     Other Sidelying Exercises Horizontal abduction R x 15 reps   therapist manual scapular cueing for retraction/dep     Shoulder Exercises: ROM/Strengthening   UBE (Upper Arm Bike) L3.0 x 6 min (3' fwd/3'back)      Manual Therapy   Manual Therapy Soft  tissue mobilization;Myofascial release    Manual therapy comments sidelying     Soft tissue mobilization STM to R shoulder complex, UT, rhomboids     Myofascial Release TPR to R UT                    PT Short Term Goals - 04/16/20 1013      PT SHORT TERM GOAL #1   Title Patient will be independent with initial HEP    Status Achieved   04/16/20            PT Long Term Goals - 05/07/20 0901      PT LONG TERM GOAL #1   Title Patient will be independent with ongoing/advanced HEP    Status On-going      PT LONG TERM GOAL #2   Title Patient to improve tissue quality as noted by reduced tissue tightness and tenderness to palpation    Status On-going      PT LONG TERM GOAL #3   Title Patient to improve R shoulder AROM to WNL without pain provocation    Status Partially Met      PT LONG TERM GOAL #4   Title Patient will demonstrate improved R shoulder strength to >/= 4+ to 5/5 for functional UE  use    Status Achieved      PT LONG TERM GOAL #5   Title Patient to report ability to perform ADLs and household chores/tasks without increased pain >1-2/10    Status On-going   04/30/20:  2-3/10 with household chores/tasks; 3-4/10 pain reaching and taking boxes down from shelves     PT LONG TERM GOAL #6   Title Patient will be able to resume running for exercise w/o increased R shoulder pain    Status Partially Met   10/19 - notes no R shoulder pain during run however B shoulder "fatigue" following run and R UT aching pain afterwards                Plan - 05/07/20 0855    Clinical Impression Statement Pt. noting she tried running on Saturday with some R shoulder fatigue and R UT aching afterwards.  LTG #6 partially achieved.  Pt. noting no significant improvement in R shoulder "pinching" pain since starting therapy due to recent flare-up of pain over this past week.  Pt. has previously met LTG #4 for strength of R shoulder.  Able to demonstrate full AROM of R shoulder however with pain at end ranges of motion with abduction, flexion, FER.  LTG #3 partially achieved.  Notes R shoulder pain rising to 3/10 with overhead reaching tasks/chores at home thus LTG #5 ongoing.  Continued anterior chest stretching (as pt. sits for significant time daily gaming at [employer]) and periscapular strengthening with TC from therapist for improved scapulohumeral rhythm with overhead reaching which was tolerated well however did reproduce overhead motion shoulder pain. Pt. verbalizing some frustration that she is still having pain with daily activities and unsure whether she wishes to continue with therapy after next visit.  Will plan to discuss with pt. further at upcoming visit.    Rehab Potential Good    PT Frequency 2x / week    PT Duration 6 weeks    PT Treatment/Interventions ADLs/Self Care Home Management;Cryotherapy;Electrical Stimulation;Iontophoresis 54m/ml Dexamethasone;Moist  Heat;Traction;Ultrasound;Functional mobility training;Therapeutic activities;Therapeutic exercise;Neuromuscular re-education;Patient/family education;Manual techniques;Passive range of motion;Dry needling;Taping;Vasopneumatic Device;Spinal Manipulations;Joint Manipulations    PT Next Visit Plan Neck/shoulder stretching/flexibility; postural strengthening; manual therapy with DN  as benefit noted to address increased muscle tension; modalities PRN    Consulted and Agree with Plan of Care Patient           Patient will benefit from skilled therapeutic intervention in order to improve the following deficits and impairments:  Decreased activity tolerance, Decreased endurance, Decreased range of motion, Decreased strength, Increased fascial restricitons, Increased muscle spasms, Impaired perceived functional ability, Impaired flexibility, Impaired UE functional use, Improper body mechanics, Postural dysfunction, Pain  Visit Diagnosis: Chronic right shoulder pain  Other muscle spasm  Other symptoms and signs involving the musculoskeletal system  Muscle weakness (generalized)  Abnormal posture  Cramp and spasm     Problem List Patient Active Problem List   Diagnosis Date Noted  . Right shoulder pain 07/02/2017  . Preventative health care 08/02/2015  . Paresthesia 06/17/2015    Bess Harvest, PTA 05/07/20 11:00 AM   [facility] [address]  [place], [place], [address] Phone: [phone]   Fax:  [phone]  Name: Chelsea Stafford MRN: [id number] Date of Birth: 1981-06-19

## 2020-05-10 ENCOUNTER — Encounter: Payer: Self-pay | Admitting: Physical Therapy

## 2020-05-10 ENCOUNTER — Ambulatory Visit: Payer: 59 | Admitting: Physical Therapy

## 2020-05-10 ENCOUNTER — Other Ambulatory Visit: Payer: Self-pay

## 2020-05-10 DIAGNOSIS — G8929 Other chronic pain: Secondary | ICD-10-CM

## 2020-05-10 DIAGNOSIS — R252 Cramp and spasm: Secondary | ICD-10-CM

## 2020-05-10 DIAGNOSIS — M25511 Pain in right shoulder: Secondary | ICD-10-CM | POA: Diagnosis not present

## 2020-05-10 DIAGNOSIS — R293 Abnormal posture: Secondary | ICD-10-CM

## 2020-05-10 DIAGNOSIS — M62838 Other muscle spasm: Secondary | ICD-10-CM

## 2020-05-10 DIAGNOSIS — R29898 Other symptoms and signs involving the musculoskeletal system: Secondary | ICD-10-CM

## 2020-05-10 DIAGNOSIS — M6281 Muscle weakness (generalized): Secondary | ICD-10-CM

## 2020-05-10 NOTE — Therapy (Signed)
[facility] [place] [address]  [place], [place], [address] Phone: [phone]   Fax:  [phone]  Physical Therapy Treatment / Progress Note  Patient Details  Name: Chelsea Stafford MRN: [id number] Date of Birth: Apr 06, 1981 Referring Provider (PT): Azucena Cecil, MD  Progress Note  Reporting Period 04/01/2020 to 05/10/2020  See note below for Objective Data and Assessment of Progress/Goals.      Encounter Date: 05/10/2020   PT End of Session - 05/10/20 0933    Visit Number 12    Number of Visits 12    Date for PT Re-Evaluation 05/13/20    Authorization Type UHC    PT Start Time 0933    PT Stop Time 1018    PT Time Calculation (min) 45 min    Activity Tolerance Patient tolerated treatment well    Behavior During Therapy [facility] for tasks assessed/performed           Past Medical History:  Diagnosis Date  . History of Bell's palsy 1999    History reviewed. No pertinent surgical history.  There were no vitals filed for this visit.   Subjective Assessment - 05/10/20 0936    Subjective Pt reports she feels stronger but still notes alot of muscle fatigue with exercies incorporating scap retraction. She states power walking and walking the dog are less painful but has not tried running again.Still notes the "pinchy pain" when reaching out to the side or overhead.    Diagnostic tests R shoulder x-ray 02/20/20: No acute or structural abnormalities.  R shoulder MRI 03/16/20: Very mild appearing supraspinatus and infraspinatus tendinopathy without tear. Trace amount of subacromial/subdeltoid fluid may be due to bursitis.    Patient Stated Goals "reduce the pain and increase my mobility"    Currently in Pain? No/denies    Pain Score 0-No pain   up to 3/10 "pinchy" pain when reaching overhead   Pain Location Shoulder    Pain Orientation Right;Upper    Pain Descriptors / Indicators Other (Comment)   "pinchy"   Pain Type Chronic  pain    Pain Frequency Intermittent              [facility] PT Assessment - 05/10/20 0933      Assessment   Medical Diagnosis Chronic R shoulder pain    Referring Provider (PT) N. Eduard Roux, MD    Onset Date/Surgical Date --   3-4 yrs   Hand Dominance Right    Next MD Visit none scheduled      Observation/Other Assessments   Focus on Therapeutic Outcomes (FOTO)  Shoulder - 59% (41% limitation)      AROM   Right Shoulder Flexion 152 Degrees   R shld discomfort at 140 dg; pain lowering    Right Shoulder ABduction 166 Degrees   pain starting at 100 dg    Right Shoulder Internal Rotation --   FIR to T6   Right Shoulder External Rotation --   FER to T3- pain      Strength   Right Shoulder Flexion 4+/5    Right Shoulder ABduction 4+/5    Right Shoulder Internal Rotation 4+/5    Right Shoulder External Rotation 4+/5    Left Shoulder Flexion 4+/5    Left Shoulder ABduction 4+/5    Left Shoulder Internal Rotation 4+/5    Left Shoulder External Rotation 4+/5  [facility] Adult PT Treatment/Exercise - 05/10/20 0933      Shoulder Exercises: ROM/Strengthening   UBE (Upper Arm Bike) L3.0 x 6 min (3' fwd/3'back)      Manual Therapy   Manual Therapy Soft tissue mobilization;Myofascial release    Soft tissue mobilization STM to R UT & scalenes    Myofascial Release manual TPR to R UT & scalenes      Neck Exercises: Stretches   Upper Trapezius Stretch Right;30 seconds;1 rep    Upper Trapezius Stretch Limitations + 1st rib mobilization    Levator Stretch Right;30 seconds;1 rep    Other Neck Stretches R scalenes stretch +/- 1st rib mobilization 2 x 30 sec - pt noting increased discomfort with prolonged hold for 1st rib mob, therefore reduced to 10 x 5"                  PT Education - 05/10/20 1015    Education Details HEP update - scalene stretches and 1st rib mobilization    Person(s) Educated Patient    Methods  Explanation;Demonstration;Tactile cues;Verbal cues;Handout    Comprehension Verbalized understanding;Verbal cues required;Tactile cues required;Returned demonstration            PT Short Term Goals - 04/16/20 1013      PT SHORT TERM GOAL #1   Title Patient will be independent with initial HEP    Status Achieved   04/16/20            PT Long Term Goals - 05/10/20 0940      PT LONG TERM GOAL #1   Title Patient will be independent with ongoing/advanced HEP    Status Achieved   05/10/20     PT LONG TERM GOAL #2   Title Patient to improve tissue quality as noted by reduced tissue tightness and tenderness to palpation    Status Partially Met      PT LONG TERM GOAL #3   Title Patient to improve R shoulder AROM to WNL without pain provocation    Status Partially Met   05/10/20 - R shoulder ROM WFL/WNL but continued "pinchy" pain noted with overhead flexion & abduction     PT LONG TERM GOAL #4   Title Patient will demonstrate improved R shoulder strength to >/= 4+ to 5/5 for functional UE use    Status Achieved   04/30/20     PT LONG TERM GOAL #5   Title Patient to report ability to perform ADLs and household chores/tasks without increased pain >1-2/10    Status Not Met   05/10/20 - 3/10 with activities requiring overhead reaching     PT LONG TERM GOAL #6   Title Patient will be able to resume running for exercise w/o increased R shoulder pain    Status Partially Met   05/07/20 - notes no R shoulder pain during run however B shoulder "fatigue" following run and R UT aching pain afterwards                Plan - 05/10/20 1018    Clinical [place] reports 25% improvement in R shoulder with PT, noting better strength and function but minimal change in pain with "pinchy" pain still noted with overhead flexion and abduction. She notes she had experienced improvement in pain midway through therapy episode but had still been very guarded with functional use at  the time and since she has started trying to use her R arm more functionally, she has noted return of  the pinching pain up to 3/10. R shoulder ROM WFL/WNL but pinching pain still limiting tolerance for overhead flexion and abduction. Continued increased muscle tension remains in R UT and scalenes with mild elevation of R 1st rib, therefore provided instruction in 1st rib mobilization and scalene stretches in addition to UT stretch previously provided. She denied need for further review of existing HEP. B shoulder strength symmetrical at 4+/5 but increased fatigue still noted on R. HEP and strength LTGs met with majority of remaining goals at least partially met with exception of pain with daily task still ~3/10. Despite ongoing deficits and goals, [place] would prefer to try to continue on her own with the HEP at this time rather than recert, but will remain on hold for 30-days in the event that she feels the need to return to PT.    Rehab Potential Good    PT Frequency 2x / week    PT Duration 6 weeks    PT Treatment/Interventions ADLs/Self Care Home Management;Cryotherapy;Electrical Stimulation;Iontophoresis 46m/ml Dexamethasone;Moist Heat;Traction;Ultrasound;Functional mobility training;Therapeutic activities;Therapeutic exercise;Neuromuscular re-education;Patient/family education;Manual techniques;Passive range of motion;Dry needling;Taping;Vasopneumatic Device;Spinal Manipulations;Joint Manipulations    PT Next Visit Plan Neck/shoulder stretching/flexibility; postural strengthening; manual therapy with DN as benefit noted to address increased muscle tension; modalities PRN    Consulted and Agree with Plan of Care Patient           Patient will benefit from skilled therapeutic intervention in order to improve the following deficits and impairments:  Decreased activity tolerance, Decreased endurance, Decreased range of motion, Decreased strength, Increased fascial restricitons, Increased muscle  spasms, Impaired perceived functional ability, Impaired flexibility, Impaired UE functional use, Improper body mechanics, Postural dysfunction, Pain  Visit Diagnosis: Chronic right shoulder pain  Other muscle spasm  Other symptoms and signs involving the musculoskeletal system  Muscle weakness (generalized)  Abnormal posture  Cramp and spasm     Problem List Patient Active Problem List   Diagnosis Date Noted  . Right shoulder pain 07/02/2017  . Preventative health care 08/02/2015  . Paresthesia 06/17/2015    JPercival Spanish PT, MPT 05/10/2020, 1:25 PM  [facility] [place] [address]Phone: [phone]  Fax:  [phone] Name: MAlisan DokesMRN: [id number] of Birth: [birth year]

## 2020-05-10 NOTE — Patient Instructions (Signed)
    Home exercise program created by Kacen Mellinger, PT.  For questions, please contact Keneshia Tena via phone at [phone] or email at [email or web address]  [facility] [place] [address]  [address] [place], [place], [address] Phone: [phone]   Fax:  [phone]    

## 2020-06-07 ENCOUNTER — Ambulatory Visit: Payer: 59 | Attending: Orthopaedic Surgery | Admitting: Physical Therapy

## 2020-06-07 ENCOUNTER — Other Ambulatory Visit: Payer: Self-pay

## 2020-06-07 ENCOUNTER — Encounter: Payer: Self-pay | Admitting: Physical Therapy

## 2020-06-07 DIAGNOSIS — M6281 Muscle weakness (generalized): Secondary | ICD-10-CM | POA: Insufficient documentation

## 2020-06-07 DIAGNOSIS — R293 Abnormal posture: Secondary | ICD-10-CM | POA: Diagnosis present

## 2020-06-07 DIAGNOSIS — M25511 Pain in right shoulder: Secondary | ICD-10-CM | POA: Diagnosis present

## 2020-06-07 DIAGNOSIS — R29898 Other symptoms and signs involving the musculoskeletal system: Secondary | ICD-10-CM | POA: Insufficient documentation

## 2020-06-07 DIAGNOSIS — G8929 Other chronic pain: Secondary | ICD-10-CM | POA: Diagnosis present

## 2020-06-07 DIAGNOSIS — R252 Cramp and spasm: Secondary | ICD-10-CM | POA: Diagnosis present

## 2020-06-07 DIAGNOSIS — M62838 Other muscle spasm: Secondary | ICD-10-CM | POA: Insufficient documentation

## 2020-06-07 NOTE — Patient Instructions (Signed)
    Home exercise program created by Amrom Ore, PT.  For questions, please contact Kindal Ponti via phone at [phone] or email at [email or web address]  [facility] [place] [address]  [address] [place], [place], [address] Phone: [phone]   Fax:  [phone]    

## 2020-06-07 NOTE — Therapy (Addendum)
[facility] [address]  [place], [place], [address] Phone: [phone]   Fax:  [phone]  Physical Therapy Treatment / Discharge Summary  Patient Details  Name: Chelsea Stafford MRN: [id number] Date of Birth: 05/31/1981 Referring Provider (PT): Azucena Cecil, MD   Encounter Date: 06/07/2020   PT End of Session - 06/07/20 0801    Visit Number 13    Date for PT Re-Evaluation 05/13/20    Authorization Type UHC    PT Start Time 0801    PT Stop Time 0843    PT Time Calculation (min) 42 min    Activity Tolerance Patient tolerated treatment well    Behavior During Therapy [facility] for tasks assessed/performed           Past Medical History:  Diagnosis Date  . History of Bell's palsy 1999    History reviewed. No pertinent surgical history.  There were no vitals filed for this visit.   Subjective Assessment - 06/07/20 0804    Subjective Pt reports pain has been gradually getting better but feels that are too many HEP exercise and would like to thin that out.    Diagnostic tests R shoulder x-ray 02/20/20: No acute or structural abnormalities.  R shoulder MRI 03/16/20: Very mild appearing supraspinatus and infraspinatus tendinopathy without tear. Trace amount of subacromial/subdeltoid fluid may be due to bursitis.    Patient Stated Goals "reduce the pain and increase my mobility"    Currently in Pain? Yes    Pain Score 2     Pain Location Shoulder   Levator scapulae   Pain Orientation Right;Upper    Pain Descriptors / Indicators Sharp   "pinching", "sharp but sporadic"   Pain Frequency Intermittent                             [facility] Adult PT Treatment/Exercise - 06/07/20 0801      Exercises   Exercises Shoulder      Shoulder Exercises: Standing   External Rotation Both;15 reps;Strengthening;Theraband    Theraband Level (Shoulder External Rotation) Level 2 (Red)    External Rotation Limitations  leaning on doorframe for tactile cueing for scap retraction    Extension Both;15 reps;Strengthening;Theraband    Theraband Level (Shoulder Extension) Level 2 (Red)    Extension Limitations cues for scap retraction/depression + slight ER with shoulder extension to neutral      Shoulder Exercises: Stretch   Internal Rotation Stretch 30 seconds    Internal Rotation Stretch Limitations cane vertical behind back - ROM WNL      Manual Therapy   Manual Therapy Other (comment)    Other Manual Therapy Review of self-STM using ball on wall for pecs, rhomboids & LS      Neck Exercises: Stretches   Upper Trapezius Stretch Right;30 seconds;1 rep    Upper Trapezius Stretch Limitations hand anchored on seat + slight overpressure with opp hand    Levator Stretch Right;30 seconds;1 rep    Levator Stretch Limitations hand anchored on seat + slight overpressure with opp hand    Corner Stretch 30 seconds;3 reps    Corner Stretch Limitations 3-way doorway stretch - pt noting no stretch with low position and discomfort with mid & upper positions, therefore deferred    Other Neck Stretches R single arm rhomboid stretch x 30 sec  PT Education - 06/07/20 0843    Education Details HEP review & condensing to single handout (per Pt instructions)    Person(s) Educated Patient    Methods Explanation;Demonstration;Handout    Comprehension Verbalized understanding;Verbal cues required;Returned demonstration            PT Short Term Goals - 04/16/20 1013      PT SHORT TERM GOAL #1   Title Patient will be independent with initial HEP    Status Achieved   04/16/20            PT Long Term Goals - 05/10/20 0940      PT LONG TERM GOAL #1   Title Patient will be independent with ongoing/advanced HEP    Status Achieved   05/10/20     PT LONG TERM GOAL #2   Title Patient to improve tissue quality as noted by reduced tissue tightness and tenderness to palpation    Status Partially  Met      PT LONG TERM GOAL #3   Title Patient to improve R shoulder AROM to WNL without pain provocation    Status Partially Met   05/10/20 - R shoulder ROM WFL/WNL but continued "pinchy" pain noted with overhead flexion & abduction     PT LONG TERM GOAL #4   Title Patient will demonstrate improved R shoulder strength to >/= 4+ to 5/5 for functional UE use    Status Achieved   04/30/20     PT LONG TERM GOAL #5   Title Patient to report ability to perform ADLs and household chores/tasks without increased pain >1-2/10    Status Not Met   05/10/20 - 3/10 with activities requiring overhead reaching     PT LONG TERM GOAL #6   Title Patient will be able to resume running for exercise w/o increased R shoulder pain    Status Partially Met   05/07/20 - notes no R shoulder pain during run however B shoulder "fatigue" following run and R UT aching pain afterwards                Plan - 06/07/20 0844    Clinical Impression Chelsea Stafford returns to PT during 30-day hold window reporting continued improvement in her pain but requesting PT assistance with streamlining her HEP as she feels that there are too many exercises to know what to focus on. Review of HEPs revealing that pt has also been including HEP handouts from prior therapy episodes. Exercises from both most recent and prior episode reviewed, identifying most effective versions of stretches and strengthening exercises with revised comprehensive HEP provided today. Chelsea Stafford reports improved confidence with revised HEP but would like to remain on hold for another 30-days in the event that further issues arise.    Rehab Potential Good    PT Treatment/Interventions ADLs/Self Care Home Management;Cryotherapy;Electrical Stimulation;Iontophoresis 2m/ml Dexamethasone;Moist Heat;Traction;Ultrasound;Functional mobility training;Therapeutic activities;Therapeutic exercise;Neuromuscular re-education;Patient/family education;Manual  techniques;Passive range of motion;Dry needling;Taping;Vasopneumatic Device;Spinal Manipulations;Joint Manipulations    PT Next Visit Plan 30-day hold    Consulted and Agree with Plan of Care Patient           Patient will benefit from skilled therapeutic intervention in order to improve the following deficits and impairments:  Decreased activity tolerance, Decreased endurance, Decreased range of motion, Decreased strength, Increased fascial restricitons, Increased muscle spasms, Impaired perceived functional ability, Impaired flexibility, Impaired UE functional use, Improper body mechanics, Postural dysfunction, Pain  Visit Diagnosis: Chronic right shoulder pain  Other muscle spasm  Other  symptoms and signs involving the musculoskeletal system  Muscle weakness (generalized)  Abnormal posture  Cramp and spasm     Problem List Patient Active Problem List   Diagnosis Date Noted  . Right shoulder pain 07/02/2017  . Preventative health care 08/02/2015  . Paresthesia 06/17/2015    Percival Spanish, PT, MPT 06/07/2020, 10:41 AM  [facility] [address]  [place], [place], [address] Phone: [phone]   Fax:  [phone]  Name: Chelsea Stafford MRN: [id number] Date of Birth: 10-31-1980  PHYSICAL THERAPY DISCHARGE SUMMARY  Visits from Start of Care: 13  Current functional level related to goals / functional outcomes:   Refer to above clinical impression for status as of last visit on 06/07/2020. Patient was placed on hold for 30 days and returned one time for HEP modification but has not needed to return to PT in >30 days, therefore will proceed with discharge from PT for this episode.   Remaining deficits:   As above.   Education / Equipment:   HEP  Plan: Patient agrees to discharge.  Patient goals were partially met. Patient is being discharged due to being pleased with the current functional level.  ?????      Percival Spanish, PT, MPT 07/22/20, 11:38 AM  [facility] [address]  [place], [place], [address] Phone: [phone]   Fax:  [phone]

## 2021-10-13 ENCOUNTER — Encounter

## 2021-10-13 ENCOUNTER — Ambulatory Visit: Payer: PRIVATE HEALTH INSURANCE

## 2021-10-13 DIAGNOSIS — Z1231 Encounter for screening mammogram for malignant neoplasm of breast: Secondary | ICD-10-CM

## 2021-10-22 ENCOUNTER — Encounter

## 2021-11-03 ENCOUNTER — Inpatient Hospital Stay: Admit: 2021-11-03 | Payer: PRIVATE HEALTH INSURANCE

## 2021-11-03 ENCOUNTER — Encounter

## 2021-11-03 DIAGNOSIS — R928 Other abnormal and inconclusive findings on diagnostic imaging of breast: Secondary | ICD-10-CM

## 2021-11-03 DIAGNOSIS — N6489 Other specified disorders of breast: Secondary | ICD-10-CM

## 2021-11-04 NOTE — Telephone Encounter (Signed)
Nurse Navigator reviewed pre-procedure ultrasound guided breast biopsy patient education information in person and gave written instructions.  Reviewed medications and need to hold all blood thinners per instructions.  None to hold.  Patient should take all other medications as prescribed.  Patient can eat and drink as normal prior to the procedure.  Be sure to wear a bra with good support and a two piece outfit for comfort.  Patient can bring someone with you but you can also drive yourself.  Plan on being at the breast center for 2-2 and a half hours.  Reviewed the process of a ultrasound biopsy.  The skin is cleaned and a local anesthetic is given to numb the area.  A small skin nick is made for the biopsy needle and then tissue samples are taken.  A tiny marker is then placed inside your breast at the site of the biopsy for future reference.    Pressure is then held on the biopsy site to stop bleeding and steri strips, bandage and waterproof dressing is applied.   A mammogram is then done to validate the tissue marker.  The tissue sample is sent to pathology. Results will come back in 2-3 business days and sent to your referring physician.  Either they or the nurse navigator will call you with the results and recommended follow up needed.  Patients states understanding.

## 2021-11-17 ENCOUNTER — Inpatient Hospital Stay: Admit: 2021-11-17 | Payer: PRIVATE HEALTH INSURANCE

## 2021-11-17 ENCOUNTER — Encounter

## 2021-11-17 DIAGNOSIS — Z9889 Other specified postprocedural states: Secondary | ICD-10-CM

## 2021-11-17 DIAGNOSIS — D242 Benign neoplasm of left breast: Secondary | ICD-10-CM

## 2021-11-17 DIAGNOSIS — R928 Other abnormal and inconclusive findings on diagnostic imaging of breast: Secondary | ICD-10-CM

## 2021-11-17 MED ORDER — LIDOCAINE-EPINEPHRINE 1 %-1:100000 IJ SOLN
1 %-:00000 | Freq: Once | INTRAMUSCULAR | Status: AC
Start: 2021-11-17 — End: 2021-11-17
  Administered 2021-11-17: 18:00:00 10 mL via INTRADERMAL

## 2021-11-17 MED ORDER — LIDOCAINE HCL (PF) 1 % IJ SOLN
1 % | Freq: Once | INTRAMUSCULAR | Status: AC
Start: 2021-11-17 — End: 2021-11-17
  Administered 2021-11-17: 18:00:00 5 mL via INTRADERMAL

## 2021-11-17 MED ORDER — NORMAL SALINE FLUSH 0.9 % IV SOLN
0.9 % | INTRAVENOUS | Status: AC
Start: 2021-11-17 — End: ?

## 2021-11-17 MED ORDER — LIDOCAINE-EPINEPHRINE 2 %-1:100000 IJ SOLN
Freq: Once | INTRAMUSCULAR | Status: DC
Start: 2021-11-17 — End: 2021-11-18

## 2021-11-17 MED ORDER — LIDOCAINE HCL (PF) 1 % IJ SOLN
1 % | Freq: Once | INTRAMUSCULAR | Status: DC
Start: 2021-11-17 — End: 2021-11-18

## 2021-11-17 MED FILL — SODIUM CHLORIDE FLUSH 0.9 % IV SOLN: 0.9 % | INTRAVENOUS | Qty: 10

## 2021-11-17 NOTE — Progress Notes (Signed)
Patient here for breast biopsy. iN reviewed the health history, allergies and medications.  Drove herself.  Radiologist reviews procedure with patient, consent signed. Patient tolerates procedure well. Compression held. Site cleansed with chloraprep, steri strips and dry dressing applied. Ice pack in place. Reviewed discharge instructions with patient and signed copy. Patient verbalized understanding and agreed to contact Nurse Navigator with any questions. Patient A&Ox3, steady on feet and discharged home.

## 2021-11-19 NOTE — Telephone Encounter (Signed)
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  Negative for atypia or malignancy. iN reviewed radiologist follow up recommendations as left breast ultrasound in 6 months.

## 2022-04-11 IMAGING — MR MR SHOULDER*R* W/O CM
4 of 5 series · 29 of 40 positions shown · non-contrast
Comparison: Plain films right shoulder 02/20/2020.

CLINICAL DATA: Right shoulder pain and limited range of motion for
2 years. No known injury.

EXAM:
MRI OF THE RIGHT SHOULDER WITHOUT CONTRAST
TECHNIQUE: Multiplanar, multisequence MR imaging of the shoulder was performed.
No intravenous contrast was administered.

[Series 6: PD fat-sat · axial · right · 4.0mm · 0.27mm/px · z∈[-33,+59]mm · 6 of 20 slices shown (1 of 2)]
[im 1/20]
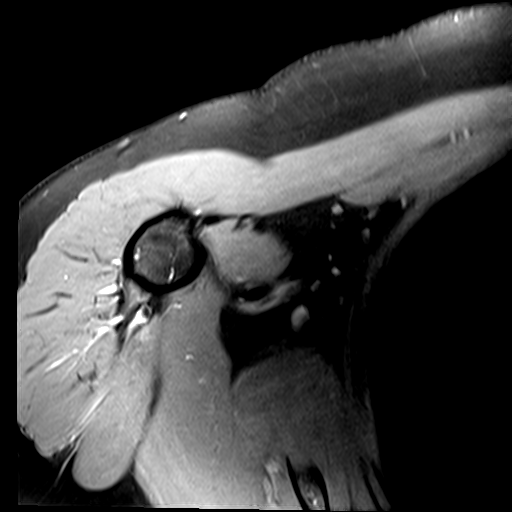
[im 4/20]
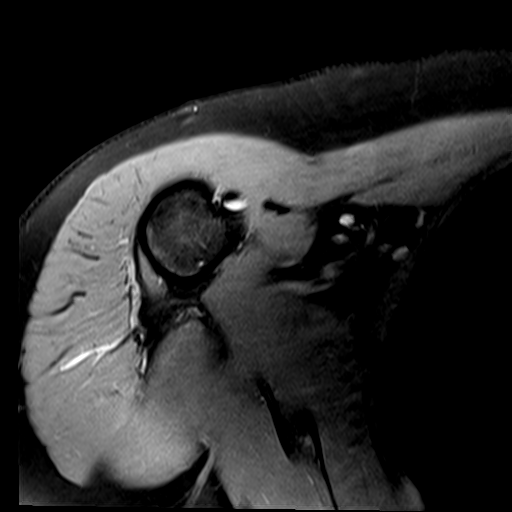
[im 8/20]
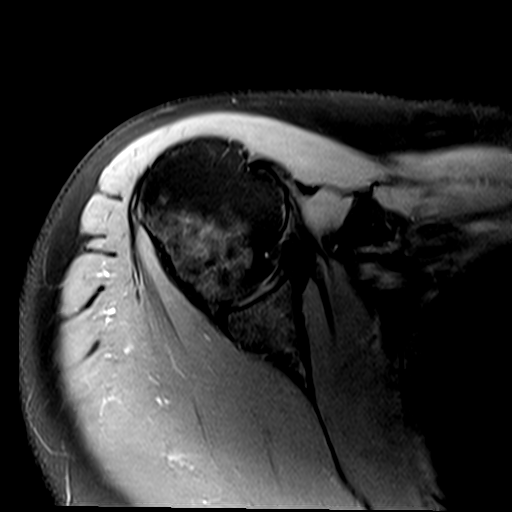
[im 12/20]
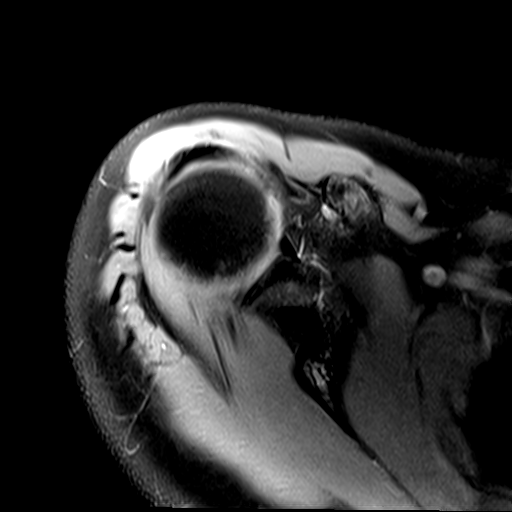
[im 16/20]
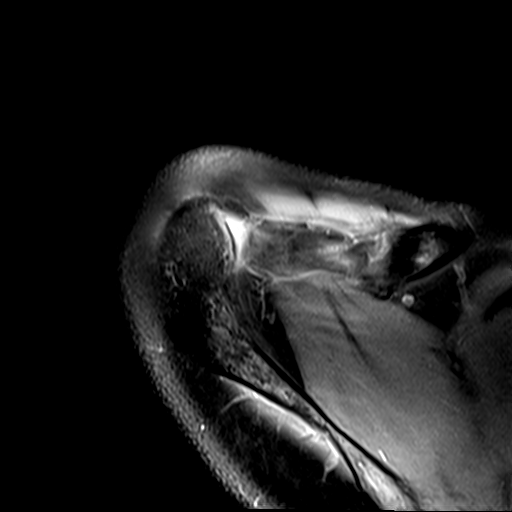
[im 20/20]
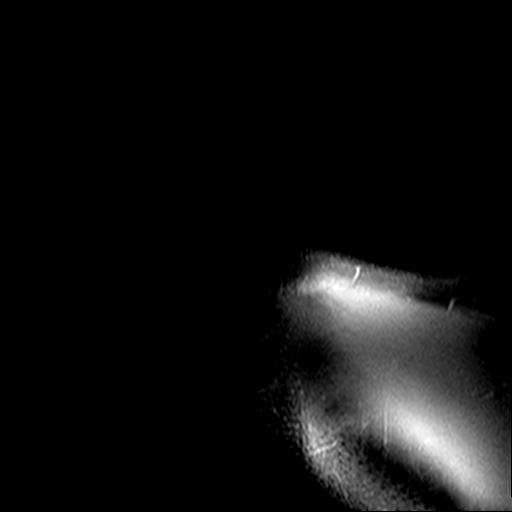

[Series 7: T2 fat-sat · oblique · right · 4.0mm · 0.27mm/px · 8 of 21 slices shown (1 of 2)]
[im 1/21]
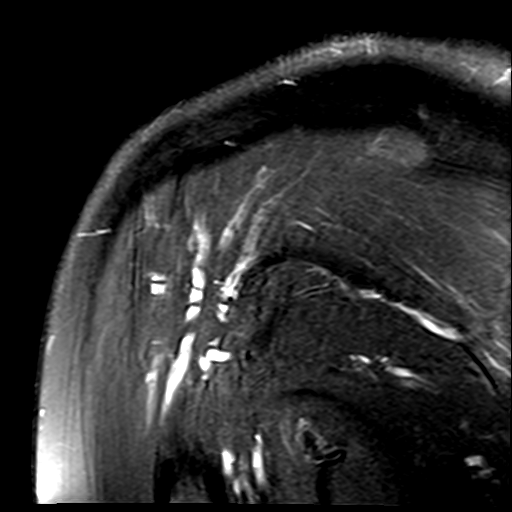
[im 3/21]
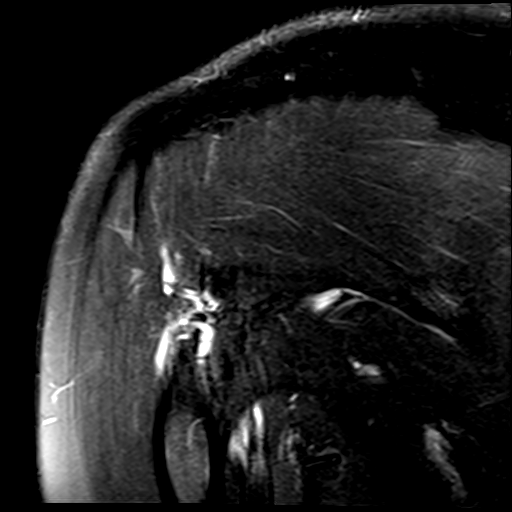
[im 6/21]
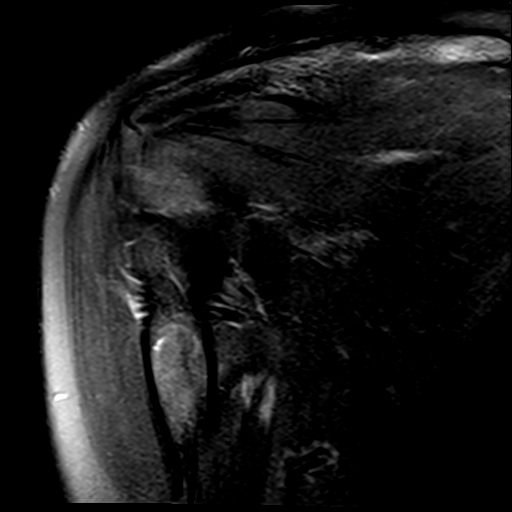
[im 9/21]
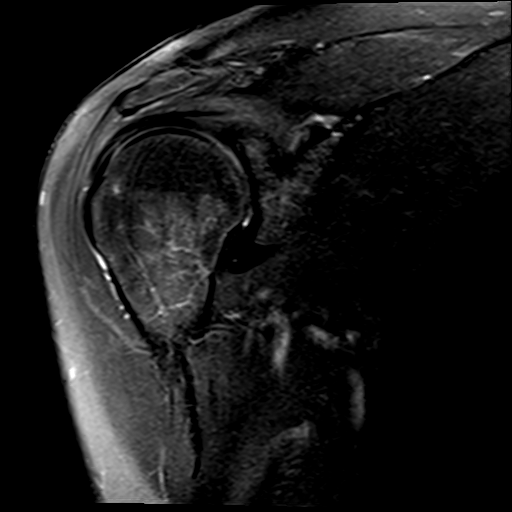
[im 12/21]
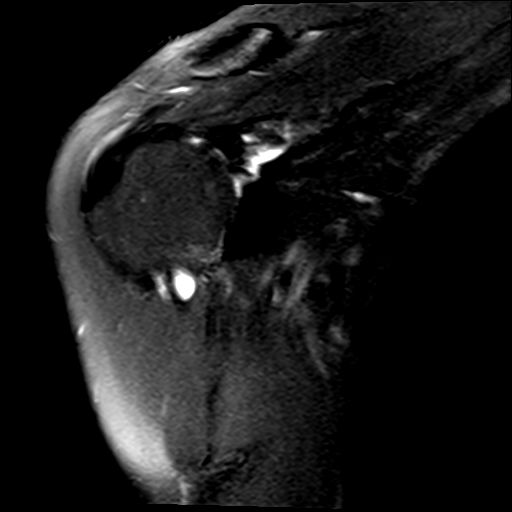
[im 15/21]
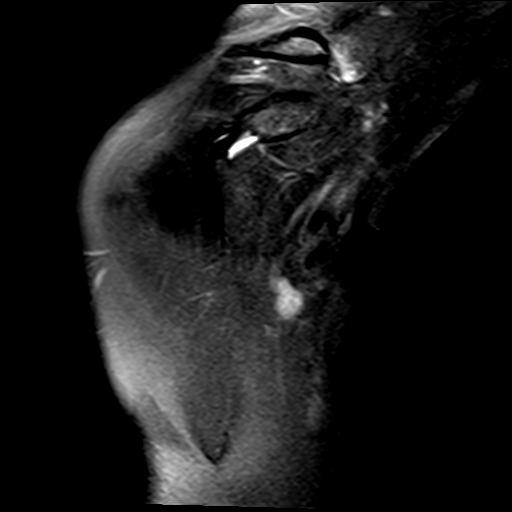
[im 18/21]
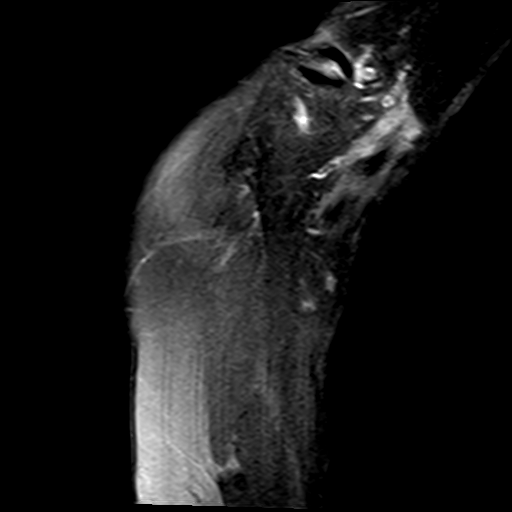
[im 21/21]
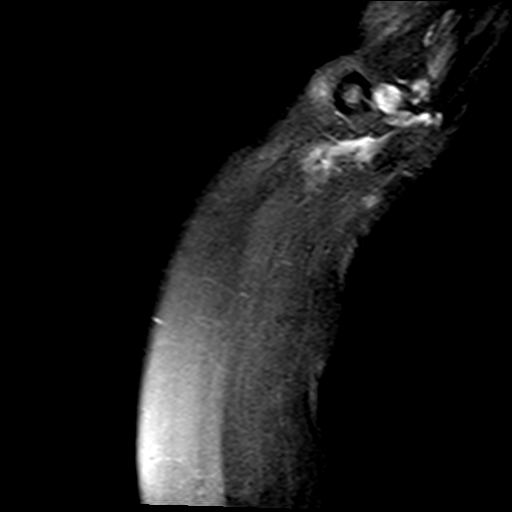

[Series 8: PD fat-sat · oblique · right · 4.0mm · 0.36mm/px · 8 of 21 slices shown (2 of 2)]
[im 1/21]
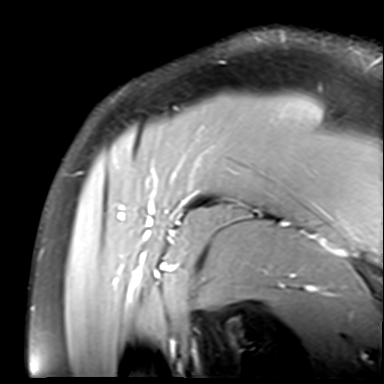
[im 3/21]
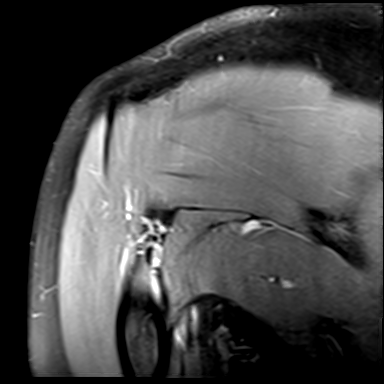
[im 6/21]
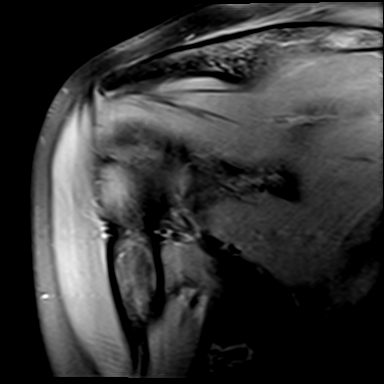
[im 9/21]
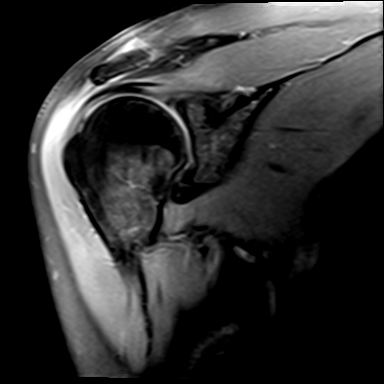
[im 12/21]
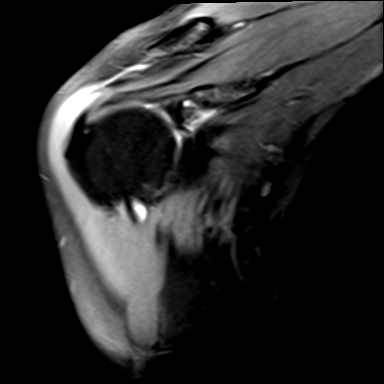
[im 15/21]
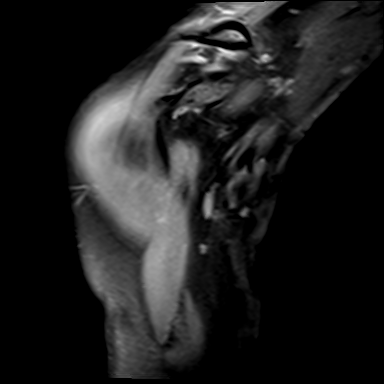
[im 18/21]
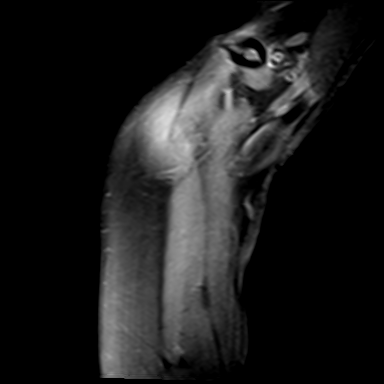
[im 21/21]
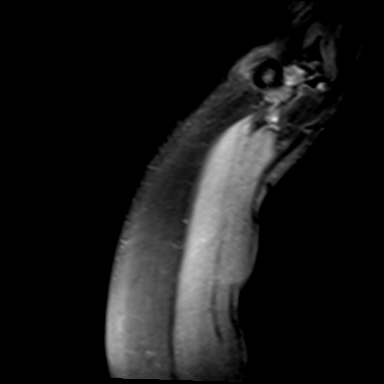

[Series 9: T2 fat-sat · coronal · right · 4.0mm · 0.73mm/px · 7 of 23 slices shown (2 of 2)]
[im 1/23]
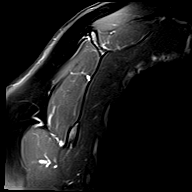
[im 3/23]
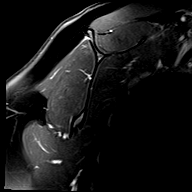
[im 6/23]
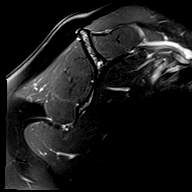
[im 9/23]
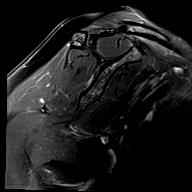
[im 12/23]
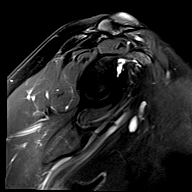
[im 14/23]
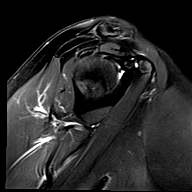
[im 20/23]
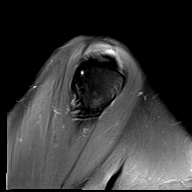

[29 of 40 positions shown; findings below may reference images not displayed]

FINDINGS: Rotator cuff: Mildly increased T2 signal in the supraspinatus and
infraspinatus is compatible with tendinopathy. The rotator cuff
otherwise appears normal.

Muscles:  Normal without atrophy or focal lesion.

Biceps long head:  Intact and normal appearance.

Acromioclavicular Joint: Normal. Type 1 acromion. Trace amount of
fluid in the subacromial/subdeltoid bursa.

Glenohumeral Joint: Normal.

Labrum:  Intact.

Bones:  Normal marrow signal throughout.

Other: None.
IMPRESSION: Very mild appearing supraspinatus and infraspinatus tendinopathy
without tear.

Trace amount of subacromial/subdeltoid fluid may be due to bursitis.

## 2022-07-01 ENCOUNTER — Encounter

## 2022-08-11 ENCOUNTER — Encounter

## 2022-08-11 ENCOUNTER — Inpatient Hospital Stay: Admit: 2022-08-11 | Payer: PRIVATE HEALTH INSURANCE | Attending: Obstetrics & Gynecology

## 2022-08-11 ENCOUNTER — Inpatient Hospital Stay: Payer: PRIVATE HEALTH INSURANCE | Attending: Obstetrics & Gynecology

## 2022-08-11 DIAGNOSIS — R928 Other abnormal and inconclusive findings on diagnostic imaging of breast: Secondary | ICD-10-CM

## 2022-08-11 DIAGNOSIS — Z09 Encounter for follow-up examination after completed treatment for conditions other than malignant neoplasm: Secondary | ICD-10-CM
